# Patient Record
Sex: Male | Born: 1965 | Race: White | Hispanic: No | Marital: Married | Smoking: Current every day smoker
Health system: Southern US, Community
[De-identification: ages and names within clinical notes are randomized; demographics above are authoritative.]

## PROBLEM LIST (undated history)

## (undated) DIAGNOSIS — E785 Hyperlipidemia, unspecified: Secondary | ICD-10-CM

## (undated) DIAGNOSIS — J449 Chronic obstructive pulmonary disease, unspecified: Secondary | ICD-10-CM

## (undated) DIAGNOSIS — F419 Anxiety disorder, unspecified: Secondary | ICD-10-CM

## (undated) DIAGNOSIS — Z1388 Encounter for screening for disorder due to exposure to contaminants: Secondary | ICD-10-CM

## (undated) DIAGNOSIS — I10 Essential (primary) hypertension: Secondary | ICD-10-CM

## (undated) DIAGNOSIS — K219 Gastro-esophageal reflux disease without esophagitis: Secondary | ICD-10-CM

## (undated) DIAGNOSIS — G894 Chronic pain syndrome: Secondary | ICD-10-CM

## (undated) DIAGNOSIS — Z720 Tobacco use: Secondary | ICD-10-CM

## (undated) DIAGNOSIS — R51 Headache: Secondary | ICD-10-CM

## (undated) DIAGNOSIS — I2510 Atherosclerotic heart disease of native coronary artery without angina pectoris: Secondary | ICD-10-CM

## (undated) DIAGNOSIS — G588 Other specified mononeuropathies: Secondary | ICD-10-CM

## (undated) DIAGNOSIS — M109 Gout, unspecified: Secondary | ICD-10-CM

## (undated) HISTORY — PX: ELBOW SURGERY: SHX618

## (undated) HISTORY — PX: CARDIAC CATHETERIZATION: SHX172

---

## 2007-08-12 HISTORY — PX: KNEE SURGERY: SHX244

## 2013-01-09 ENCOUNTER — Emergency Department (HOSPITAL_COMMUNITY): Payer: 59

## 2013-01-09 ENCOUNTER — Encounter (HOSPITAL_COMMUNITY): Payer: Self-pay | Admitting: *Deleted

## 2013-01-09 ENCOUNTER — Inpatient Hospital Stay (HOSPITAL_COMMUNITY)
Admission: EM | Admit: 2013-01-09 | Discharge: 2013-01-11 | DRG: 251 | Disposition: A | Discharge status: Home / Self Care | Payer: 59 | Attending: Cardiology | Admitting: Cardiology

## 2013-01-09 DIAGNOSIS — J449 Chronic obstructive pulmonary disease, unspecified: Secondary | ICD-10-CM | POA: Diagnosis present

## 2013-01-09 DIAGNOSIS — F17200 Nicotine dependence, unspecified, uncomplicated: Secondary | ICD-10-CM | POA: Diagnosis present

## 2013-01-09 DIAGNOSIS — Z8249 Family history of ischemic heart disease and other diseases of the circulatory system: Secondary | ICD-10-CM

## 2013-01-09 DIAGNOSIS — I214 Non-ST elevation (NSTEMI) myocardial infarction: Secondary | ICD-10-CM

## 2013-01-09 DIAGNOSIS — F411 Generalized anxiety disorder: Secondary | ICD-10-CM | POA: Diagnosis present

## 2013-01-09 DIAGNOSIS — I10 Essential (primary) hypertension: Secondary | ICD-10-CM | POA: Diagnosis present

## 2013-01-09 DIAGNOSIS — I2510 Atherosclerotic heart disease of native coronary artery without angina pectoris: Secondary | ICD-10-CM | POA: Diagnosis present

## 2013-01-09 DIAGNOSIS — E7800 Pure hypercholesterolemia, unspecified: Secondary | ICD-10-CM | POA: Diagnosis present

## 2013-01-09 DIAGNOSIS — J4489 Other specified chronic obstructive pulmonary disease: Secondary | ICD-10-CM | POA: Diagnosis present

## 2013-01-09 DIAGNOSIS — E785 Hyperlipidemia, unspecified: Secondary | ICD-10-CM

## 2013-01-09 DIAGNOSIS — I2582 Chronic total occlusion of coronary artery: Secondary | ICD-10-CM | POA: Diagnosis present

## 2013-01-09 DIAGNOSIS — D45 Polycythemia vera: Secondary | ICD-10-CM | POA: Diagnosis present

## 2013-01-09 HISTORY — DX: Chronic obstructive pulmonary disease, unspecified: J44.9

## 2013-01-09 HISTORY — DX: Atherosclerotic heart disease of native coronary artery without angina pectoris: I25.10

## 2013-01-09 HISTORY — DX: Hyperlipidemia, unspecified: E78.5

## 2013-01-09 HISTORY — DX: Encounter for screening for disorder due to exposure to contaminants: Z13.88

## 2013-01-09 HISTORY — DX: Tobacco use: Z72.0

## 2013-01-09 HISTORY — DX: Anxiety disorder, unspecified: F41.9

## 2013-01-09 HISTORY — DX: Headache: R51

## 2013-01-09 HISTORY — DX: Essential (primary) hypertension: I10

## 2013-01-09 LAB — COMPREHENSIVE METABOLIC PANEL
Albumin: 3.9 g/dL (ref 3.5–5.2)
Alkaline Phosphatase: 61 U/L (ref 39–117)
BUN: 5 mg/dL — ABNORMAL LOW (ref 6–23)
CO2: 27 mEq/L (ref 19–32)
Chloride: 98 mEq/L (ref 96–112)
GFR calc Af Amer: >90 mL/min (ref >90)
GFR calc non Af Amer: >90 mL/min (ref >90)
Glucose, Bld: 106 mg/dL — ABNORMAL HIGH (ref 70–99)
Potassium: 4.2 mEq/L (ref 3.5–5.1)
Total Bilirubin: 0.4 mg/dL (ref 0.3–1.2)

## 2013-01-09 LAB — POCT I-STAT, CHEM 8
Calcium, Ion: 1.1 mmol/L — ABNORMAL LOW (ref 1.12–1.23)
Chloride: 102 mEq/L (ref 96–112)
HCT: 51 % (ref 39.0–52.0)
TCO2: 28 mmol/L (ref 0–100)

## 2013-01-09 LAB — CBC WITH DIFFERENTIAL/PLATELET
Lymphocytes Relative: 30 % (ref 12–46)
Lymphs Abs: 2.1 10*3/uL (ref 0.7–4.0)
MCH: 34.6 pg — ABNORMAL HIGH (ref 26.0–34.0)
MCHC: 35.9 g/dL (ref 30.0–36.0)
MCV: 96.4 fL (ref 78.0–100.0)
Monocytes Relative: 7 % (ref 3–12)
Platelets: 155 10*3/uL (ref 150–400)
RDW: 12.7 % (ref 11.5–15.5)
WBC: 7.2 10*3/uL (ref 4.0–10.5)

## 2013-01-09 LAB — HEPARIN LEVEL (UNFRACTIONATED): Heparin Unfractionated: <0.1 IU/mL — ABNORMAL LOW (ref 0.30–0.70)

## 2013-01-09 LAB — TYPE AND SCREEN: Antibody Screen: NEGATIVE

## 2013-01-09 LAB — PROTIME-INR: INR: 0.91 (ref 0.00–1.49)

## 2013-01-09 LAB — POCT I-STAT TROPONIN I: Troponin i, poc: 3.53 ng/mL (ref 0.00–0.08)

## 2013-01-09 LAB — ABO/RH: ABO/RH(D): A POS

## 2013-01-09 LAB — TROPONIN I: Troponin I: 4.37 ng/mL (ref <0.30)

## 2013-01-09 MED ORDER — ONDANSETRON HCL 4 MG/2ML IJ SOLN: 4.0000 mg | Freq: Four times a day (QID) | INTRAMUSCULAR | Status: DC | PRN

## 2013-01-09 MED ORDER — IOHEXOL 350 MG/ML SOLN
125.0000 mL | Freq: Once | INTRAVENOUS | Status: AC | PRN
  Administered 2013-01-09: 125 mL via INTRAVENOUS

## 2013-01-09 MED ORDER — ZOLPIDEM TARTRATE 5 MG PO TABS: 5.0000 mg | ORAL_TABLET | Freq: Every evening | ORAL | Status: DC | PRN

## 2013-01-09 MED ORDER — SODIUM CHLORIDE 0.9 % IV SOLN: INTRAVENOUS | Status: AC

## 2013-01-09 MED ORDER — DIAZEPAM 5 MG PO TABS
5.0000 mg | ORAL_TABLET | ORAL | Status: AC
  Administered 2013-01-10: 5 mg via ORAL
  Filled 2013-01-09: qty 1

## 2013-01-09 MED ORDER — SODIUM CHLORIDE 0.9 % IJ SOLN: 3.0000 mL | INTRAMUSCULAR | Status: DC | PRN

## 2013-01-09 MED ORDER — HEPARIN BOLUS VIA INFUSION
2000.0000 [IU] | Freq: Once | INTRAVENOUS | Status: AC
  Administered 2013-01-09: 2000 [IU] via INTRAVENOUS
  Filled 2013-01-09: qty 2000

## 2013-01-09 MED ORDER — NITROGLYCERIN 0.4 MG SL SUBL: 0.4000 mg | SUBLINGUAL_TABLET | SUBLINGUAL | Status: DC | PRN

## 2013-01-09 MED ORDER — SODIUM CHLORIDE 0.9 % IJ SOLN
3.0000 mL | Freq: Two times a day (BID) | INTRAMUSCULAR | Status: DC
  Administered 2013-01-09 – 2013-01-11 (×3): 3 mL via INTRAVENOUS

## 2013-01-09 MED ORDER — SODIUM CHLORIDE 0.9 % IV SOLN: 250.0000 mL | INTRAVENOUS | Status: DC | PRN

## 2013-01-09 MED ORDER — SODIUM CHLORIDE 0.9 % IV SOLN
INTRAVENOUS | Status: DC
  Administered 2013-01-09: 15:00:00 via INTRAVENOUS
  Administered 2013-01-09: 10 mL/h via INTRAVENOUS

## 2013-01-09 MED ORDER — ASPIRIN EC 81 MG PO TBEC
81.0000 mg | DELAYED_RELEASE_TABLET | Freq: Every day | ORAL | Status: DC
  Administered 2013-01-10 – 2013-01-11 (×2): 81 mg via ORAL
  Filled 2013-01-09 (×2): qty 1

## 2013-01-09 MED ORDER — ACETAMINOPHEN 325 MG PO TABS
650.0000 mg | ORAL_TABLET | ORAL | Status: DC | PRN
  Administered 2013-01-10 – 2013-01-11 (×2): 650 mg via ORAL
  Filled 2013-01-09 (×2): qty 2

## 2013-01-09 MED ORDER — HEPARIN BOLUS VIA INFUSION
4000.0000 [IU] | Freq: Once | INTRAVENOUS | Status: AC
  Administered 2013-01-09: 4000 [IU] via INTRAVENOUS

## 2013-01-09 MED ORDER — FAMOTIDINE 20 MG PO TABS
20.0000 mg | ORAL_TABLET | Freq: Two times a day (BID) | ORAL | Status: DC
  Administered 2013-01-09 – 2013-01-11 (×4): 20 mg via ORAL
  Filled 2013-01-09 (×5): qty 1

## 2013-01-09 MED ORDER — AMLODIPINE BESYLATE 10 MG PO TABS
10.0000 mg | ORAL_TABLET | Freq: Every day | ORAL | Status: DC
  Administered 2013-01-10: 10 mg via ORAL
  Filled 2013-01-09 (×3): qty 1

## 2013-01-09 MED ORDER — ALPRAZOLAM 0.5 MG PO TABS
1.0000 mg | ORAL_TABLET | Freq: Two times a day (BID) | ORAL | Status: DC | PRN
  Administered 2013-01-09 – 2013-01-11 (×4): 1 mg via ORAL
  Filled 2013-01-09: qty 2
  Filled 2013-01-09 (×2): qty 1
  Filled 2013-01-09 (×2): qty 2

## 2013-01-09 MED ORDER — HEPARIN (PORCINE) IN NACL 100-0.45 UNIT/ML-% IJ SOLN
1650.0000 [IU]/h | INTRAMUSCULAR | Status: DC
  Administered 2013-01-09: 1000 [IU]/h via INTRAVENOUS
  Administered 2013-01-10: 1300 [IU]/h via INTRAVENOUS
  Filled 2013-01-09 (×4): qty 250

## 2013-01-09 MED ORDER — METOPROLOL TARTRATE 25 MG PO TABS
25.0000 mg | ORAL_TABLET | Freq: Two times a day (BID) | ORAL | Status: DC
  Administered 2013-01-09 – 2013-01-11 (×4): 25 mg via ORAL
  Filled 2013-01-09 (×6): qty 1

## 2013-01-09 MED ORDER — SODIUM CHLORIDE 0.9 % IJ SOLN: 3.0000 mL | Freq: Two times a day (BID) | INTRAMUSCULAR | Status: DC

## 2013-01-09 MED ORDER — NITROGLYCERIN 0.4 MG SL SUBL
SUBLINGUAL_TABLET | SUBLINGUAL | Status: AC
  Administered 2013-01-09: 14:00:00
  Filled 2013-01-09: qty 25

## 2013-01-09 MED ORDER — ALPRAZOLAM ER 1 MG PO TB24: 1.0000 mg | ORAL_TABLET | Freq: Two times a day (BID) | ORAL | Status: DC | PRN

## 2013-01-09 MED ORDER — ATORVASTATIN CALCIUM 80 MG PO TABS
80.0000 mg | ORAL_TABLET | Freq: Every day | ORAL | Status: DC
  Administered 2013-01-09 – 2013-01-10 (×2): 80 mg via ORAL
  Filled 2013-01-09 (×3): qty 1

## 2013-01-09 MED ORDER — SODIUM CHLORIDE 0.9 % IV SOLN
INTRAVENOUS | Status: DC
  Administered 2013-01-09 – 2013-01-10 (×2): via INTRAVENOUS

## 2013-01-09 MED ORDER — ASPIRIN 81 MG PO CHEW
CHEWABLE_TABLET | ORAL | Status: AC
Start: 2013-01-09 — End: 2013-01-09
  Administered 2013-01-09: 14:00:00
  Filled 2013-01-09: qty 4

## 2013-01-09 NOTE — ED Notes (Signed)
Patient transported to CT 

## 2013-01-09 NOTE — ED Notes (Signed)
Pt states yesterday had a burning pain across chest, 10/10, pain in upper back and pain down both arms, today states pain is central chest, 2/10, and pain in shoulders, denies SOB, denies n/v/d, states having numbness/tingling in hands.

## 2013-01-09 NOTE — ED Notes (Signed)
Critical I stat Troponin 3.53.  Dr Freida Busman and RN notified by [facility] 1317.

## 2013-01-09 NOTE — Progress Notes (Signed)
ANTICOAGULATION CONSULT NOTE - Initial Consult  Pharmacy Consult for Heparin Indication: chest pain/ACS  No Known Allergies  Patient Measurements: Height: 5\' 11"  (180.3 cm) Weight: 198 lb (89.812 kg) IBW/kg (Calculated) : 75.3 Heparin Dosing Weight: 89.8kg  Vital Signs: Temp: 98 F (36.7 C) (06/01 1250) Temp src: Oral (06/01 1250) BP: 126/82 mmHg (06/01 1400) Pulse Rate: 86 (06/01 1400)  Labs:  Recent Labs  01/09/13 1300 01/09/13 1339  HGB 17.4* 17.3*  HCT 48.5 51.0  PLT 155  --   APTT 29  --   LABPROT 12.2  --   INR 0.91  --   CREATININE 0.72 0.70    Estimated Creatinine Clearance: 124.2 ml/min (by C-G formula based on Cr of 0.7).   Medical History: Past Medical History  Diagnosis Date  . Screening for chemical poisoning and contamination   . Hypertension     Assessment: 47 y.o. male presenting with chest pain. EKG reveals NSTEMI. Aspirin 81mg  given in ED. Plan transfer to [facility] CCU. Baseline coags wnl.  Goal of Therapy:  Heparin level 0.3-0.7 units/ml Monitor platelets by anticoagulation protocol: Yes   Plan:   Heparin 4000 units IV x 1, then  Heparin 1000 units/hr IV infusion  Check heparin level in 6hrs  Check daily heparin level, CBC  Loralee Pacas, PharmD, BCPS Pager: [phone] 01/09/2013,2:48 PM

## 2013-01-09 NOTE — Progress Notes (Signed)
ANTICOAGULATION CONSULT NOTE  Pharmacy Consult for Heparin Indication: chest pain/ACS  No Known Allergies  Patient Measurements: Height: 6' (182.9 cm) Weight: 189 lb 6 oz (85.9 kg) IBW/kg (Calculated) : 77.6 Heparin Dosing Weight: 89.8kg  Vital Signs: Temp: 98.4 F (36.9 C) (06/01 2000) Temp src: Oral (06/01 2000) BP: 129/81 mmHg (06/01 2100) Pulse Rate: 65 (06/01 2100)  Labs:  Recent Labs  01/09/13 1300 01/09/13 1339 01/09/13 1813 01/09/13 2054  HGB 17.4* 17.3*  --   --   HCT 48.5 51.0  --   --   PLT 155  --   --   --   APTT 29  --   --   --   LABPROT 12.2  --   --   --   INR 0.91  --   --   --   HEPARINUNFRC  --   --   --  <0.10*  CREATININE 0.72 0.70  --   --   TROPONINI  --   --  4.37*  --     Estimated Creatinine Clearance: 128 ml/min (by C-G formula based on Cr of 0.7).  Assessment: 47 y.o. Male with chest pain for heparin  Goal of Therapy:  Heparin level 0.3-0.7 units/ml Monitor platelets by anticoagulation protocol: Yes   Plan:  Heparin 2000 units IV bolus, then increase heparin  1300 units/hr Follow-up am labs.  Geannie Risen, PharmD, BCPS

## 2013-01-09 NOTE — ED Provider Notes (Signed)
History     CSN: [id number]  Arrival date & time 01/09/13  1235   First MD Initiated Contact with Patient 01/09/13 1248      Chief Complaint  Patient presents with  . Chest Pain    (Consider location/radiation/quality/duration/timing/severity/associated sxs/prior treatment) Patient is a 47 y.o. male presenting with chest pain. The history is provided by the patient.  Chest Pain  patient here complaining of substernal chest pain radiating to his arms and back which began yesterday. Symptoms are constant all day yesterday and not associated with syncope or near-syncope. He did have some dyspnea and diaphoresis. Symptoms resolved and were nonexertional. These symptoms returned again today at rest and has subsided this time and lasted for approximately 30-60 minutes. Denies any numbness or tingling to his arms or legs. No recent fever or cough. No treatment used prior to arrival. Nothing made her symptoms better  Past Medical History  Diagnosis Date  . Screening for chemical poisoning and contamination   . High cholesterol     Past Surgical History  Procedure Laterality Date  . Knee surgery      History reviewed. No pertinent family history.  History  Substance Use Topics  . Smoking status: Current Every Day Smoker  . Smokeless tobacco: Never Used  . Alcohol Use: Yes      Review of Systems  Cardiovascular: Positive for chest pain.  All other systems reviewed and are negative.    Allergies  Review of patient's allergies indicates no known allergies.  Home Medications   Current Outpatient Rx  Name  Route  Sig  Dispense  Refill  . ALPRAZolam (XANAX XR) 1 MG 24 hr tablet   Oral   Take 1 mg by mouth 2 (two) times daily as needed (anxiety).         . ALPRAZolam (XANAX) 1 MG tablet   Oral   Take 1 mg by mouth 2 (two) times daily as needed for sleep (anxiety).         Marland Kitchen amLODipine (NORVASC) 10 MG tablet   Oral   Take 10 mg by mouth daily.         .  famotidine (PEPCID) 20 MG tablet   Oral   Take 20 mg by mouth 2 (two) times daily.           BP 159/101  Pulse 95  Temp(Src) 98 F (36.7 C) (Oral)  Resp 20  SpO2 96%  Physical Exam  Nursing note and vitals reviewed. Constitutional: He is oriented to person, place, and time. He appears well-developed and well-nourished.  Non-toxic appearance. No distress.  HENT:  Head: Normocephalic and atraumatic.  Eyes: Conjunctivae, EOM and lids are normal. Pupils are equal, round, and reactive to light.  Neck: Normal range of motion. Neck supple. No tracheal deviation present. No mass present.  Cardiovascular: Normal rate, regular rhythm and normal heart sounds.  Exam reveals no gallop.   No murmur heard. Pulmonary/Chest: Effort normal and breath sounds normal. No stridor. No respiratory distress. He has no decreased breath sounds. He has no wheezes. He has no rhonchi. He has no rales.  Abdominal: Soft. Normal appearance and bowel sounds are normal. He exhibits no distension. There is no tenderness. There is no rebound and no CVA tenderness.  Musculoskeletal: Normal range of motion. He exhibits no edema and no tenderness.  Neurological: He is alert and oriented to person, place, and time. He has normal strength. No cranial nerve deficit or sensory deficit. GCS eye subscore  is 4. GCS verbal subscore is 5. GCS motor subscore is 6.  Skin: Skin is warm and dry. No abrasion and no rash noted.  Psychiatric: He has a normal mood and affect. His speech is normal and behavior is normal.    ED Course  Procedures (including critical care time)  Labs Reviewed  POCT I-STAT TROPONIN I - Abnormal; Notable for the following:    Troponin i, poc 3.53 (*)    All other components within normal limits  CBC WITH DIFFERENTIAL  COMPREHENSIVE METABOLIC PANEL  PROTIME-INR  APTT  TYPE AND SCREEN   No results found.   No diagnosis found.    MDM   Date: 01/09/2013  Rate: 89  Rhythm: normal sinus rhythm   QRS Axis: normal  Intervals: normal  ST/T Wave abnormalities: normal  Conduction Disutrbances:none  Narrative Interpretation:   Old EKG Reviewed: none available  2:32 PM Patient had chest CT to rule out dissection which was negative. He was given aspirin here and also heparinized. He is currently pain-free. I have spoken with Dr.Mcclean and patient to be transferred to [facility] cone to the CCU. No evidence of STEMI on the patient's EKG although the patient is having a non-STEMI        Toy Baker, MD 01/09/13 1432

## 2013-01-09 NOTE — H&P (Signed)
History and Physical   Patient ID: [name] [name] MRN: [id number], DOB/AGE: Jun 24, 1966   Admit date: 01/09/2013 Date of Consult: 01/09/2013   Primary Physician: No primary provider on file. Primary Cardiologist: New  HPI: [name] [name] is a 47 y.o. male with PMHx s/f hypertension, ongoing tobacco abuse and family history of premature CAD who presents to [facility] ED today with chest pain.   He was in his USOH until approximately 1 month ago when he began experiencing fleeting episodes of substernal chest burning which he related to indigestion. This remained stable over some time. Beginning on Friday, he reported experiencing more severe episodes described as anterior chest burning with radiation to his arms bilaterally aggravated by exertion and relieved with rest. Over the weekend these episodes became more frequent and severe (rated at a 9/10). He denies associated symptoms, specifically nausea, diaphoresis, SOB/DOE, LE edema, PND, orthopnea, palpitations, lightheadedness or syncope. The discomfort persisted thus prompting his ED presentation.   He also notes an episode of lightheadedness, weakness and hand incoordination after working on a roof in the sun. He attributed this to dehydration and rested in his car with some improvement. He does endorse bilateral hand tingling, but indicates he has chronic musculoskeletal ailments from physical labor. He recently had a complete physical (12/06/12) revealing LDL 93, HDL 48, TG 261, TC 193, normal TSH and otherwise normal blood work.   In the ED, EKG reveals NSR, inferior IVCD, no acute ischemic changes. Initial trop-I 3.53. Full labwork pending- BMET unremarkale. CBC indicates a mild erythrocytosis. CT-A and CXR pending. VSS. He received a full-dose ASA. He is currently pain free.   Problem List: Past Medical History  Diagnosis Date  . Screening for chemical poisoning and contamination   . Hypertension   . Tobacco abuse     Past Surgical History    Procedure Laterality Date  . Knee surgery       Allergies: No Known Allergies  Home Medications: Prior to Admission medications   Medication Sig Start Date End Date Taking? Authorizing Provider  ALPRAZolam (XANAX XR) 1 MG 24 hr tablet Take 1 mg by mouth 2 (two) times daily as needed (anxiety).   Yes Historical Provider, MD  ALPRAZolam Prudy Feeler) 1 MG tablet Take 1 mg by mouth 2 (two) times daily as needed for sleep (anxiety).   Yes Historical Provider, MD  amLODipine (NORVASC) 10 MG tablet Take 10 mg by mouth daily.   Yes Historical Provider, MD  famotidine (PEPCID) 20 MG tablet Take 20 mg by mouth 2 (two) times daily.   Yes Historical Provider, MD    Inpatient Medications:     (Not in a hospital admission)  Family History  Problem Relation Age of Onset  . CAD Brother 82     History   Social History  . Marital Status: Married    Spouse Name: N/A    Number of Children: N/A  . Years of Education: N/A   Occupational History  . Not on file.   Social History Main Topics  . Smoking status: Current Every Day Smoker -- 1.50 packs/day  . Smokeless tobacco: Never Used  . Alcohol Use: Yes  . Drug Use: No  . Sexually Active: Not on file   Other Topics Concern  . Not on file   Social History Narrative  . No narrative on file    Review of Systems: General: positive generalized fatigue, for reduced appetite, increased stress, weight loss, negative for chills, fever, night sweats Cardiovascular: positive  for chest pain, negative for dyspnea on exertion, edema, orthopnea, palpitations, paroxysmal nocturnal dyspnea or shortness of breath Dermatological: negative for rash Respiratory: negative for cough or wheezing Urologic: negative for hematuria Abdominal:  negative for nausea, vomiting, diarrhea, bright red blood per rectum, melena, or hematemesis Neurologic: positive for bilateral hand tingling, negative for visual changes, syncope, or dizziness All other systems reviewed and  are otherwise negative except as noted above.  Physical Exam: Blood pressure 126/82, pulse 86, temperature 98 F (36.7 C), temperature source Oral, resp. rate 18, height 5\' 11"  (1.803 m), weight 89.812 kg (198 lb), SpO2 99.00%.    General: Well developed, well nourished, in no acute distress. Head: Normocephalic, atraumatic, sclera non-icteric, no xanthomas, nares are without discharge.  Neck: Negative for carotid bruits. JVD not elevated. Lungs:  He has + wheezes with expiration.   Breathing is unlabored. Heart: RRR with S1 S2. No murmurs, rubs,   Abdomen:  Soft, non-tender, non-distended with normoactive bowel sounds. No hepatomegaly. No rebound/guarding. No obvious abdominal masses. Msk: Strength and tone appears normal for age. Extremities:  No clubbing, cyanosis or edema.  Distal pedal pulses are 2+ and equal bilaterally. Good right radial pulse.  Neuro: Alert and oriented X 3. Moves all extremities spontaneously. Psych:  Responds to questions appropriately with a normal affect.  Labs: Recent Labs     01/09/13  1300  01/09/13  1339  WBC  7.2   --   HGB  17.4*  17.3*  HCT  48.5  51.0  MCV  96.4   --   PLT  155   --    Recent Labs Lab 01/09/13 1300 01/09/13 1339  NA 137 137  K 4.2 4.1  CL 98 102  CO2 27  --   BUN 5* 4*  CREATININE 0.72 0.70  CALCIUM 9.2  --   PROT 7.2  --   BILITOT 0.4  --   ALKPHOS 61  --   ALT 25  --   AST 56*  --   GLUCOSE 106* 102*   Radiology/Studies: No results found.  EKG: NSR, 89 bpm, IVCD III, aVF, no ST/T changes  ASSESSMENT AND PLAN:   47 y.o. male with PMHx s/f hypertension, ongoing tobacco abuse and family history of premature CAD who presents to [facility] ED today with chest pain.   1. NSTEMI 2. Hypertension 3. Ongoing tobacco abuse 4. Polycythemia  The patient presents with a one month history of intermittent precordial discomfort described as burning radiating to his arm bilaterally, alleviated with rest, but occurring  for frequently and qualified as more severe. Cardiac risk factors include hypertension, ongoing tobacco abuse and family history of premature CAD in his brother. Objectively, troponin-I has returned elevated. EKG indicates inferior conduction abnormality, but no acute ischemia. Plan in ED has been made to rule out PE and ascending thoracic aortic dissection on CT-A. This along with a CXR is pending at this time. If no other pathology explaining NSTEMI is demonstrated, will plan to heparinize and admit to [facility]. He is currently pain free and hemodynamically stable. If this remains the case, will plan to proceed with cardiac catheterization tomorrow. Cycle cardiac biomarkers. Hydrate overnight. Offer NRT as a means of tobacco cessation assistance. Add BB and high-dose statin.    Signed, R. Hurman Horn, PA-C 01/09/2013, 3:01 PM   Attending Note:   The patient was seen and examined.  Agree with assessment and plan as noted above.  Changes made to the above note  as needed.  Very nice gentleman with hx of HTN and hyperlipidemia and family hx of premature CAD.  Presents with NSTEMI that started yesterday.  He feels better today.    He works in the [employer] - does lots of physical labor.  Has been having indigestion like symptoms with CP radiating to both arms.  Yesterday , he woke up with those symptoms and they lasted for hours.  Will admit. Transfer to stepdown at [facility].  Add heparin. Anticipate cath tomorrow. We have discussed risks, benefits, options of cath.  He understands and agrees to proceed  Alvia Grove., MD, [facility] 01/09/2013, 3:03 PM

## 2013-01-10 ENCOUNTER — Encounter (HOSPITAL_COMMUNITY)
Admission: EM | Disposition: A | Discharge status: Home / Self Care | Payer: Self-pay | Source: Home / Self Care | Attending: Cardiology

## 2013-01-10 DIAGNOSIS — I214 Non-ST elevation (NSTEMI) myocardial infarction: Secondary | ICD-10-CM

## 2013-01-10 DIAGNOSIS — I2510 Atherosclerotic heart disease of native coronary artery without angina pectoris: Secondary | ICD-10-CM

## 2013-01-10 HISTORY — PX: LEFT HEART CATHETERIZATION WITH CORONARY ANGIOGRAM: SHX5451

## 2013-01-10 LAB — BASIC METABOLIC PANEL
CO2: 28 mEq/L (ref 19–32)
Calcium: 9 mg/dL (ref 8.4–10.5)
Potassium: 3.8 mEq/L (ref 3.5–5.1)
Sodium: 138 mEq/L (ref 135–145)

## 2013-01-10 LAB — CBC
MCH: 34.2 pg — ABNORMAL HIGH (ref 26.0–34.0)
MCHC: 34.3 g/dL (ref 30.0–36.0)
Platelets: 142 10*3/uL — ABNORMAL LOW (ref 150–400)
RBC: 4.62 MIL/uL (ref 4.22–5.81)

## 2013-01-10 LAB — TROPONIN I: Troponin I: 4.33 ng/mL (ref <0.30)

## 2013-01-10 SURGERY — LEFT HEART CATHETERIZATION WITH CORONARY ANGIOGRAM
Anesthesia: LOCAL

## 2013-01-10 MED ORDER — VERAPAMIL HCL 2.5 MG/ML IV SOLN
INTRAVENOUS | Status: AC
  Filled 2013-01-10: qty 2

## 2013-01-10 MED ORDER — VITAMIN B-1 100 MG PO TABS
100.0000 mg | ORAL_TABLET | Freq: Every day | ORAL | Status: DC
  Administered 2013-01-10 – 2013-01-11 (×2): 100 mg via ORAL
  Filled 2013-01-10 (×2): qty 1

## 2013-01-10 MED ORDER — SODIUM CHLORIDE 0.9 % IV SOLN: INTRAVENOUS | Status: AC

## 2013-01-10 MED ORDER — MIDAZOLAM HCL 2 MG/2ML IJ SOLN
INTRAMUSCULAR | Status: AC
  Filled 2013-01-10: qty 2

## 2013-01-10 MED ORDER — LIDOCAINE HCL (PF) 1 % IJ SOLN
INTRAMUSCULAR | Status: AC
  Filled 2013-01-10: qty 30

## 2013-01-10 MED ORDER — BIVALIRUDIN 250 MG IV SOLR
INTRAVENOUS | Status: AC
  Filled 2013-01-10: qty 250

## 2013-01-10 MED ORDER — LORAZEPAM 2 MG/ML IJ SOLN: 1.0000 mg | Freq: Four times a day (QID) | INTRAMUSCULAR | Status: DC | PRN

## 2013-01-10 MED ORDER — HEPARIN (PORCINE) IN NACL 2-0.9 UNIT/ML-% IJ SOLN
INTRAMUSCULAR | Status: AC
  Filled 2013-01-10: qty 1000

## 2013-01-10 MED ORDER — NITROGLYCERIN IN D5W 200-5 MCG/ML-% IV SOLN
2.0000 ug/min | INTRAVENOUS | Status: DC
  Administered 2013-01-10: 10 ug/min via INTRAVENOUS
  Filled 2013-01-10: qty 250

## 2013-01-10 MED ORDER — THIAMINE HCL 100 MG/ML IJ SOLN
100.0000 mg | Freq: Every day | INTRAMUSCULAR | Status: DC
  Filled 2013-01-10 (×2): qty 1

## 2013-01-10 MED ORDER — LORAZEPAM 1 MG PO TABS: 1.0000 mg | ORAL_TABLET | Freq: Four times a day (QID) | ORAL | Status: DC | PRN

## 2013-01-10 MED ORDER — FENTANYL CITRATE 0.05 MG/ML IJ SOLN
INTRAMUSCULAR | Status: AC
  Filled 2013-01-10: qty 2

## 2013-01-10 MED ORDER — PRASUGREL HCL 10 MG PO TABS
10.0000 mg | ORAL_TABLET | Freq: Every day | ORAL | Status: DC
  Administered 2013-01-11: 10 mg via ORAL
  Filled 2013-01-10: qty 1

## 2013-01-10 MED ORDER — FOLIC ACID 1 MG PO TABS
1.0000 mg | ORAL_TABLET | Freq: Every day | ORAL | Status: DC
  Administered 2013-01-10 – 2013-01-11 (×2): 1 mg via ORAL
  Filled 2013-01-10 (×2): qty 1

## 2013-01-10 MED ORDER — ADULT MULTIVITAMIN W/MINERALS CH
1.0000 | ORAL_TABLET | Freq: Every day | ORAL | Status: DC
  Administered 2013-01-10 – 2013-01-11 (×2): 1 via ORAL
  Filled 2013-01-10 (×2): qty 1

## 2013-01-10 MED ORDER — PRASUGREL HCL 10 MG PO TABS
ORAL_TABLET | ORAL | Status: AC
  Filled 2013-01-10: qty 6

## 2013-01-10 MED ORDER — HEPARIN SODIUM (PORCINE) 1000 UNIT/ML IJ SOLN
INTRAMUSCULAR | Status: AC
  Filled 2013-01-10: qty 1

## 2013-01-10 NOTE — Progress Notes (Signed)
PROGRESS NOTE  Subjective:   Pt is doing well.    Objective:    Vital Signs:   Temp:  [98 F (36.7 C)-98.5 F (36.9 C)] 98 F (36.7 C) (06/02 0402) Pulse Rate:  [65-95] 65 (06/01 2100) Resp:  [12-28] 15 (06/02 0700) BP: (102-159)/(67-101) 129/72 mmHg (06/02 0402) SpO2:  [96 %-100 %] 97 % (06/02 0402) Weight:  [189 lb (85.73 kg)-198 lb (89.812 kg)] 189 lb (85.73 kg) (06/02 0405)  Last BM Date: 01/09/13   24-hour weight change: Weight change:   Weight trends: Filed Weights   01/09/13 1444 01/09/13 1700 01/10/13 0405  Weight: 198 lb (89.812 kg) 189 lb 6 oz (85.9 kg) 189 lb (85.73 kg)    Intake/Output:  06/01 0701 - 06/02 0700 In: 1334.5 [P.O.:440; I.V.:894.5] Out: 1500 [Urine:1500]     Physical Exam: BP 129/72  Pulse 65  Temp(Src) 98 F (36.7 C) (Oral)  Resp 15  Ht 6' (1.829 m)  Wt 189 lb (85.73 kg)  BMI 25.63 kg/m2  SpO2 97%  General: Vital signs reviewed and noted.   Head: Normocephalic, atraumatic.  Eyes: conjunctivae/corneas clear.  EOM's intact.   Throat: normal  Neck:  normal  Lungs:    clear  Heart:  RR, normal S1, S2  Abdomen:  Soft, non-tender, non-distended    Extremities: No c/c/e   Neurologic: A&O X3, CN II - XII are grossly intact.   Psych: Normal     Labs: BMET:  Recent Labs  01/09/13 1300 01/09/13 1339 01/10/13 0416  NA 137 137 138  K 4.2 4.1 3.8  CL 98 102 102  CO2 27  --  28  GLUCOSE 106* 102* 98  BUN 5* 4* 6  CREATININE 0.72 0.70 0.73  CALCIUM 9.2  --  9.0    Liver function tests:  Recent Labs  01/09/13 1300  AST 56*  ALT 25  ALKPHOS 61  BILITOT 0.4  PROT 7.2  ALBUMIN 3.9   No results found for this basename: LIPASE, AMYLASE,  in the last 72 hours  CBC:  Recent Labs  01/09/13 1300 01/09/13 1339 01/10/13 0600  WBC 7.2  --  6.8  NEUTROABS 4.4  --   --   HGB 17.4* 17.3* 15.8  HCT 48.5 51.0 46.1  MCV 96.4  --  99.8  PLT 155  --  142*    Cardiac Enzymes:  Recent Labs  01/09/13 1813  01/09/13 2326  TROPONINI 4.37* 4.33*    Coagulation Studies:  Recent Labs  01/09/13 1300  LABPROT 12.2  INR 0.91    Other: No components found with this basename: POCBNP,  No results found for this basename: DDIMER,  in the last 72 hours No results found for this basename: HGBA1C,  in the last 72 hours No results found for this basename: CHOL, HDL, LDLCALC, TRIG, CHOLHDL,  in the last 72 hours No results found for this basename: TSH, T4TOTAL, FREET3, T3FREE, THYROIDAB,  in the last 72 hours No results found for this basename: VITAMINB12, FOLATE, FERRITIN, TIBC, IRON, RETICCTPCT,  in the last 72 hours   Other results:  Tele:  NSR:    Medications:    Infusions: . sodium chloride 50 mL/hr at 01/10/13 0530  . heparin 1,650 Units/hr (01/10/13 0657)    Scheduled Medications: . amLODipine  10 mg Oral Daily  . aspirin EC  81 mg Oral Daily  . atorvastatin  80 mg Oral q1800  . diazepam  5 mg Oral On Call  .  famotidine  20 mg Oral BID  . folic acid  1 mg Oral Daily  . metoprolol tartrate  25 mg Oral BID  . multivitamin with minerals  1 tablet Oral Daily  . sodium chloride  3 mL Intravenous Q12H  . sodium chloride  3 mL Intravenous Q12H  . thiamine  100 mg Oral Daily   Or  . thiamine  100 mg Intravenous Daily    Assessment/ Plan:    1. CAD:  For cath this am.  Will anticipate right radial approach.    Disposition: cath today. Length of Stay: 1  [name], [name]., MD, [facility] 01/10/2013, 7:44 AM Office [phone] Pager [phone]

## 2013-01-10 NOTE — CV Procedure (Signed)
    Cardiac Cath Note  [name] [name] [id number] [birth year]  Procedure: Left  Heart Cardiac Catheterization Note Indications: NSTEMI,   Procedure Details Consent: Obtained Time Out: Verified patient identification, verified procedure, site/side was marked, verified correct patient position, special equipment/implants available, Radiology Safety Procedures followed,  medications/allergies/relevent history reviewed, required imaging and test results available.  Performed   Medications: Fentanyl: 50 mcg IV Versed: 4 mg IV Verapamil 3 mg IA Heparin 4500 units   The right radial  artery was easily canulated using a modified Seldinger technique.  Hemodynamics:    LV pressure: 102/11 Aortic pressure: 105/68  Angiography   Left Main: normal  Left anterior Descending: normal , high 1st diagonal , normal.  The 2nd diagonal has a 40-50% stenosis.  Left Circumflex: large and dominant.  OM1 is small - moderate in size and is normal. OM2 - large and normal.  PLA branch is large and normal   Right Coronary Artery: occluded proximally.  The distal vessel fills via left to right collaterals.  There is a large RV marginal branch that arises prior to the occlusion.  LV Gram: low - normal LV systolic function.  There is a small area of hypokinesis of the mid inferior wall.  EF 55%  Complications: No apparent complications Patient did tolerate procedure well.  Contrast used: 70 cc  Conclusions:   1. 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  The occlusion appears to be recent. 2. Overall well preserved LV function with  A small area of hypokinesis of the mid inferior wall.   I have discussed the case with Dr. Sanjuana Kava who will proceed with PCI of the RCA .   [name] [name], [name] [name]., MD, [facility] 01/10/2013, 12:41 PM Office - [phone] Pager [phone]

## 2013-01-10 NOTE — CV Procedure (Signed)
   Cardiac Catheterization Operative Report  [name] [name] [id number] 6/2/20141:10 PM No primary provider on file.  Procedure Performed:  1. PTCA with balloon angioplasty only of the occluded RCA  Operator: Verne Carrow, MD  Indication:   47 yo male with history of tobacco abuse, hyperlipidemia admitted with NSTEMI. Diagnostic cath this am per Dr. Elease Hashimoto with occlusion of RCA in the proximal segment. I am asked to attempt PCI of the occluded vessel.                                      Procedure Details: The risks, benefits, complications, treatment options, and expected outcomes were discussed with the patient. The patient and/or family concurred with the proposed plan, giving informed consent. When I entered the case, a 5 French sheath was present in the right radial artery. I exchanged the sheath for a 6 [place] system. The patient was given 60 mg Effient po x 1. He was given a bolus of Angiomax and a drip was started. I then engaged the RCA with a 6 [place] JR4 guide. When the ACT was greater than 200, I passed a BMW wire down the RCA. The wire passed easily beyond the proximal total occlusion. I then inflated a 2.5 x 12 mm balloon x 4 in the proximal and mid vessel. There was faint flow in the mid and distal vessel however the vessel appeared to be small in caliber and diffusely diseased. IC NTG was given. There was TIMI-1 flow into the mid and distal vessel. I reviewed the films with Dr. Elease Hashimoto and we made the decision to terminate the procedure. Diagnostic cath did show filling of the right PDA from left to right collaterals. The wire and guide were removed. The sheath was removed.  A Terumo hemostasis band was applied on the right wrist.   There were no immediate complications. The patient was taken to the recovery area in stable condition.   Hemodynamic Findings: Central aortic pressure: 105/68  Impression: 1. NSTEMI secondary to total occlusion of the proximal RCA 2. PTCA of the  proximal RCA with balloon angioplasty only. The RCA was found to be small in caliber and diffusely diseased from the proximal vessel throughout the mid and distal vessel and into the PDA.   Recommendations: I would treat him with dual anti-platelet therapy (ASA/Effient) for at least one month. Continue statin and beta blocker.        Complications:  None; patient tolerated the procedure well.

## 2013-01-10 NOTE — H&P (View-Only) (Signed)
 PROGRESS NOTE  Subjective:   Pt is doing well.    Objective:    Vital Signs:   Temp:  [98 F (36.7 C)-98.5 F (36.9 C)] 98 F (36.7 C) (06/02 0402) Pulse Rate:  [65-95] 65 (06/01 2100) Resp:  [12-28] 15 (06/02 0700) BP: (102-159)/(67-101) 129/72 mmHg (06/02 0402) SpO2:  [96 %-100 %] 97 % (06/02 0402) Weight:  [189 lb (85.73 kg)-198 lb (89.812 kg)] 189 lb (85.73 kg) (06/02 0405)  Last BM Date: 01/09/13   24-hour weight change: Weight change:   Weight trends: Filed Weights   01/09/13 1444 01/09/13 1700 01/10/13 0405  Weight: 198 lb (89.812 kg) 189 lb 6 oz (85.9 kg) 189 lb (85.73 kg)    Intake/Output:  06/01 0701 - 06/02 0700 In: 1334.5 [P.O.:440; I.V.:894.5] Out: 1500 [Urine:1500]     Physical Exam: BP 129/72  Pulse 65  Temp(Src) 98 F (36.7 C) (Oral)  Resp 15  Ht 6' (1.829 m)  Wt 189 lb (85.73 kg)  BMI 25.63 kg/m2  SpO2 97%  General: Vital signs reviewed and noted.   Head: Normocephalic, atraumatic.  Eyes: conjunctivae/corneas clear.  EOM's intact.   Throat: normal  Neck:  normal  Lungs:    clear  Heart:  RR, normal S1, S2  Abdomen:  Soft, non-tender, non-distended    Extremities: No c/c/e   Neurologic: A&O X3, CN II - XII are grossly intact.   Psych: Normal     Labs: BMET:  Recent Labs  01/09/13 1300 01/09/13 1339 01/10/13 0416  NA 137 137 138  K 4.2 4.1 3.8  CL 98 102 102  CO2 27  --  28  GLUCOSE 106* 102* 98  BUN 5* 4* 6  CREATININE 0.72 0.70 0.73  CALCIUM 9.2  --  9.0    Liver function tests:  Recent Labs  01/09/13 1300  AST 56*  ALT 25  ALKPHOS 61  BILITOT 0.4  PROT 7.2  ALBUMIN 3.9   No results found for this basename: LIPASE, AMYLASE,  in the last 72 hours  CBC:  Recent Labs  01/09/13 1300 01/09/13 1339 01/10/13 0600  WBC 7.2  --  6.8  NEUTROABS 4.4  --   --   HGB 17.4* 17.3* 15.8  HCT 48.5 51.0 46.1  MCV 96.4  --  99.8  PLT 155  --  142*    Cardiac Enzymes:  Recent Labs  01/09/13 1813  01/09/13 2326  TROPONINI 4.37* 4.33*    Coagulation Studies:  Recent Labs  01/09/13 1300  LABPROT 12.2  INR 0.91    Other: No components found with this basename: POCBNP,  No results found for this basename: DDIMER,  in the last 72 hours No results found for this basename: HGBA1C,  in the last 72 hours No results found for this basename: CHOL, HDL, LDLCALC, TRIG, CHOLHDL,  in the last 72 hours No results found for this basename: TSH, T4TOTAL, FREET3, T3FREE, THYROIDAB,  in the last 72 hours No results found for this basename: VITAMINB12, FOLATE, FERRITIN, TIBC, IRON, RETICCTPCT,  in the last 72 hours   Other results:  Tele:  NSR:    Medications:    Infusions: . sodium chloride 50 mL/hr at 01/10/13 0530  . heparin 1,650 Units/hr (01/10/13 0657)    Scheduled Medications: . amLODipine  10 mg Oral Daily  . aspirin EC  81 mg Oral Daily  . atorvastatin  80 mg Oral q1800  . diazepam  5 mg Oral On Call  .   famotidine  20 mg Oral BID  . folic acid  1 mg Oral Daily  . metoprolol tartrate  25 mg Oral BID  . multivitamin with minerals  1 tablet Oral Daily  . sodium chloride  3 mL Intravenous Q12H  . sodium chloride  3 mL Intravenous Q12H  . thiamine  100 mg Oral Daily   Or  . thiamine  100 mg Intravenous Daily    Assessment/ Plan:    1. CAD:  For cath this am.  Will anticipate right radial approach.    Disposition: cath today. Length of Stay: 1  Adi Seales J. Roger Fasnacht, Jr., MD, FACC 01/10/2013, 7:44 AM Office [phone] Pager [phone]    

## 2013-01-10 NOTE — Interval H&P Note (Signed)
History and Physical Interval Note:  01/10/2013 11:54 AM  [name] [name]  has presented today for surgery, with the diagnosis of cp  The various methods of treatment have been discussed with the patient and family. After consideration of risks, benefits and other options for treatment, the patient has consented to  Procedure(s): LEFT HEART CATHETERIZATION WITH CORONARY ANGIOGRAM (N/A) as a surgical intervention .  The patient's history has been reviewed, patient examined, no change in status, stable for surgery.  I have reviewed the patient's chart and labs.  Questions were answered to the patient's satisfaction.     Elyn Aquas.

## 2013-01-10 NOTE — Progress Notes (Signed)
ANTICOAGULATION CONSULT NOTE  Pharmacy Consult for Heparin Indication: chest pain/ACS  No Known Allergies  Patient Measurements: Height: 6' (182.9 cm) Weight: 189 lb (85.73 kg) IBW/kg (Calculated) : 77.6 Heparin Dosing Weight: 89.8kg  Vital Signs: Temp: 98 F (36.7 C) (06/02 0402) Temp src: Oral (06/02 0402) BP: 129/72 mmHg (06/02 0402) Pulse Rate: 65 (06/01 2100)  Labs:  Recent Labs  01/09/13 1300 01/09/13 1339 01/09/13 1813 01/09/13 2054 01/09/13 2326 01/10/13 0416 01/10/13 0600  HGB 17.4* 17.3*  --   --   --   --  15.8  HCT 48.5 51.0  --   --   --   --  46.1  PLT 155  --   --   --   --   --  142*  APTT 29  --   --   --   --   --   --   LABPROT 12.2  --   --   --   --   --   --   INR 0.91  --   --   --   --   --   --   HEPARINUNFRC  --   --   --  <0.10*  --   --  0.16*  CREATININE 0.72 0.70  --   --   --  0.73  --   TROPONINI  --   --  4.37*  --  4.33*  --   --     Estimated Creatinine Clearance: 128 ml/min (by C-G formula based on Cr of 0.73).  Assessment: 47 y.o. Male with chest pain for heparin. Heparin level (0.16) is below-goal on 1300 units/hr. Plan for cath later today.   Goal of Therapy:  Heparin level 0.3-0.7 units/ml Monitor platelets by anticoagulation protocol: Yes   Plan:  1. Increase IV heparin to 1650 units/hr.  2. Heparin level in 6 hours vs post-cath.   Lorre Munroe, PharmD 01/10/13, 0981 AM

## 2013-01-11 ENCOUNTER — Encounter: Payer: Self-pay | Admitting: *Deleted

## 2013-01-11 ENCOUNTER — Encounter (HOSPITAL_COMMUNITY): Payer: Self-pay | Admitting: Physician Assistant

## 2013-01-11 ENCOUNTER — Other Ambulatory Visit: Payer: Self-pay | Admitting: Physician Assistant

## 2013-01-11 ENCOUNTER — Telehealth: Payer: Self-pay | Admitting: Cardiovascular Disease

## 2013-01-11 DIAGNOSIS — I2510 Atherosclerotic heart disease of native coronary artery without angina pectoris: Secondary | ICD-10-CM | POA: Insufficient documentation

## 2013-01-11 DIAGNOSIS — I214 Non-ST elevation (NSTEMI) myocardial infarction: Secondary | ICD-10-CM

## 2013-01-11 DIAGNOSIS — I10 Essential (primary) hypertension: Secondary | ICD-10-CM

## 2013-01-11 DIAGNOSIS — E785 Hyperlipidemia, unspecified: Secondary | ICD-10-CM

## 2013-01-11 LAB — CBC
HCT: 43.8 % (ref 39.0–52.0)
Hemoglobin: 15.3 g/dL (ref 13.0–17.0)
MCH: 35 pg — ABNORMAL HIGH (ref 26.0–34.0)
MCHC: 34.9 g/dL (ref 30.0–36.0)

## 2013-01-11 LAB — BASIC METABOLIC PANEL
BUN: 10 mg/dL (ref 6–23)
Calcium: 9.2 mg/dL (ref 8.4–10.5)
GFR calc non Af Amer: >90 mL/min (ref >90)
Glucose, Bld: 104 mg/dL — ABNORMAL HIGH (ref 70–99)

## 2013-01-11 LAB — LIPID PANEL: LDL Cholesterol: 82 mg/dL (ref 0–99)

## 2013-01-11 MED ORDER — ALBUTEROL SULFATE HFA 108 (90 BASE) MCG/ACT IN AERS
2.0000 | INHALATION_SPRAY | Freq: Four times a day (QID) | RESPIRATORY_TRACT | Status: DC | PRN
  Filled 2013-01-11: qty 6.7

## 2013-01-11 MED ORDER — ALBUTEROL SULFATE HFA 108 (90 BASE) MCG/ACT IN AERS: 2.0000 | INHALATION_SPRAY | Freq: Four times a day (QID) | RESPIRATORY_TRACT | Status: DC | PRN

## 2013-01-11 MED ORDER — METOPROLOL TARTRATE 25 MG PO TABS: 25.0000 mg | ORAL_TABLET | Freq: Two times a day (BID) | ORAL | Status: DC

## 2013-01-11 MED ORDER — PRASUGREL HCL 10 MG PO TABS: 10.0000 mg | ORAL_TABLET | Freq: Every day | ORAL | Status: DC

## 2013-01-11 MED ORDER — ROSUVASTATIN CALCIUM 10 MG PO TABS: 10.0000 mg | ORAL_TABLET | Freq: Every day | ORAL | Status: DC

## 2013-01-11 MED ORDER — NITROGLYCERIN 0.4 MG SL SUBL: 0.4000 mg | SUBLINGUAL_TABLET | SUBLINGUAL | Status: DC | PRN

## 2013-01-11 MED ORDER — AMLODIPINE BESYLATE 5 MG PO TABS
5.0000 mg | ORAL_TABLET | Freq: Every day | ORAL | Status: DC
  Administered 2013-01-11: 5 mg via ORAL
  Filled 2013-01-11: qty 1

## 2013-01-11 MED ORDER — LISINOPRIL 10 MG PO TABS: 10.0000 mg | ORAL_TABLET | Freq: Every day | ORAL | Status: DC

## 2013-01-11 MED ORDER — AMLODIPINE BESYLATE 5 MG PO TABS: 5.0000 mg | ORAL_TABLET | Freq: Every day | ORAL | Status: DC

## 2013-01-11 MED ORDER — LISINOPRIL 10 MG PO TABS
10.0000 mg | ORAL_TABLET | Freq: Every day | ORAL | Status: DC
  Administered 2013-01-11: 10 mg via ORAL
  Filled 2013-01-11: qty 1

## 2013-01-11 MED ORDER — ASPIRIN 81 MG PO TBEC: 81.0000 mg | DELAYED_RELEASE_TABLET | Freq: Every day | ORAL | Status: DC

## 2013-01-11 MED FILL — Sodium Chloride IV Soln 0.9%: INTRAVENOUS | Qty: 50 | Status: AC

## 2013-01-11 NOTE — Progress Notes (Signed)
1320 Discharge instructions explained to patient with understanding verbalized. PIV removed, catheter intact, site clean and dry. Pt discharged home with wife.

## 2013-01-11 NOTE — Progress Notes (Signed)
PROGRESS NOTE  Subjective:   Pt is doing well.    Was found to have a completely occluded RCA.  We attempted PCI but when the complete occlusion was opened, we saw that the RCA was severely and diffusely diseased and was not a candidate for stenting.  He has developed left to right collaterals. He remains pain free.  Objective:    Vital Signs:   Temp:  [97.8 F (36.6 C)-98.7 F (37.1 C)] 98.2 F (36.8 C) (06/03 0414) Pulse Rate:  [54-77] 76 (06/02 1900) Resp:  [18] 18 (06/02 0750) BP: (108-137)/(66-93) 124/75 mmHg (06/03 0414) SpO2:  [93 %-98 %] 94 % (06/03 0414) Weight:  [192 lb 3.2 oz (87.181 kg)] 192 lb 3.2 oz (87.181 kg) (06/03 0414)  Last BM Date: 01/09/13   24-hour weight change: Weight change: -5 lb 12.8 oz (-2.631 kg)  Weight trends: Filed Weights   01/09/13 1700 01/10/13 0405 01/11/13 0414  Weight: 189 lb 6 oz (85.9 kg) 189 lb (85.73 kg) 192 lb 3.2 oz (87.181 kg)    Intake/Output:  06/02 0701 - 06/03 0700 In: 1197 [P.O.:550; I.V.:647] Out: 750 [Urine:750]     Physical Exam: BP 124/75  Pulse 76  Temp(Src) 98.2 F (36.8 C) (Oral)  Resp 18  Ht 6' (1.829 m)  Wt 192 lb 3.2 oz (87.181 kg)  BMI 26.06 kg/m2  SpO2 94%  General: Vital signs reviewed and noted.   Head: Normocephalic, atraumatic.  Eyes: conjunctivae/corneas clear.  EOM's intact.   Throat: normal  Neck:  normal  Lungs:   Bilateral wheezing  Heart:  RR, normal S1, S2  Abdomen:  Soft, non-tender, non-distended    Extremities: No c/c/e   Neurologic: A&O X3, CN II - XII are grossly intact.   Psych: Normal     Labs: BMET:  Recent Labs  01/10/13 0416 01/11/13 0430  NA 138 137  K 3.8 3.9  CL 102 101  CO2 28 26  GLUCOSE 98 104*  BUN 6 10  CREATININE 0.73 0.89  CALCIUM 9.0 9.2    Liver function tests:  Recent Labs  01/09/13 1300  AST 56*  ALT 25  ALKPHOS 61  BILITOT 0.4  PROT 7.2  ALBUMIN 3.9   No results found for this basename: LIPASE, AMYLASE,  in the last 72  hours  CBC:  Recent Labs  01/09/13 1300  01/10/13 0600 01/11/13 0430  WBC 7.2  --  6.8 9.3  NEUTROABS 4.4  --   --   --   HGB 17.4*  < > 15.8 15.3  HCT 48.5  < > 46.1 43.8  MCV 96.4  --  99.8 100.2*  PLT 155  --  142* 140*  < > = values in this interval not displayed.  Cardiac Enzymes:  Recent Labs  01/09/13 1813 01/09/13 2326  TROPONINI 4.37* 4.33*    Coagulation Studies:  Recent Labs  01/09/13 1300  LABPROT 12.2  INR 0.91     Other results:  Tele:  NSR:    Medications:    Infusions: . nitroGLYCERIN 10 mcg/min (01/10/13 1700)    Scheduled Medications: . amLODipine  10 mg Oral Daily  . aspirin EC  81 mg Oral Daily  . atorvastatin  80 mg Oral q1800  . famotidine  20 mg Oral BID  . folic acid  1 mg Oral Daily  . metoprolol tartrate  25 mg Oral BID  . multivitamin with minerals  1 tablet Oral Daily  . prasugrel  10 mg  Oral Daily  . sodium chloride  3 mL Intravenous Q12H  . thiamine  100 mg Oral Daily   Or  . thiamine  100 mg Intravenous Daily    Assessment/ Plan:    1. CAD:  continous medical therapy.  Add crestor 10 mg a day Continue effient for 1 month.  Continue ASA indefinitely. Add ntg prn.  2. COPD / asthma:  Will add albuterol prn.  He needs to see his medical doctor or a pulmonlogist  and get started on standard COPD / asthma meds.   3. Hyperlipidemia:  Starting crestor 10 a day.  He has not tolerated lipitor in the past.   4. HTN:  i would like for him to be on Lisinopril.  Will decrease norvasc to 5 and start Lisiniopril 10    Disposition: cath today. Length of Stay: 2  [name], [name]., MD, [facility] 01/11/2013, 7:29 AM Office [phone] Pager [phone]

## 2013-01-11 NOTE — Discharge Summary (Signed)
Discharge Summary   Patient ID: [name] [name] MRN: [id number], DOB/AGE: 1966/01/12 47 y.o. Admit date: 01/09/2013 D/C date:     01/11/2013  Primary Cardiologist: Nahser  Primary Discharge Diagnoses:  1. NSTEMI/newly diagnosed CAD - prox RCA occlusion s/p PTCA 01/10/13 2. COPD/asthma 3. HLD 4. HTN - statin started this admission, consider OP f/u labs 5. Tobacco abuse  Secondary Discharge Diagnoses:  1. Screening for chemical poisoning and contamination 2. Anxiety 3. Headache  Hospital Course: [name] [name] is is 47 y.o. male with PMHx s/f hypertension, tobacco abuse and family history of premature CAD who presented to [facility] ED on 01/09/13. Over the past 1 month he has been experiencing fleeting episodes of substernal chest burning which he related to indigestion. However, this became progressively more severe and frequent, aggravated by exertion and relieved with rest thus he came to the ED. In the ER, EKG reveals NSR, inferior IVCD, no acute ischemic changes. Initial troponin was elevated at 3.53 and later peaked at 4.37. He received ASA and was started on IV heparin per pharmacy. CT angio was performed showing no aortic dissection, aneurysm, or other acute abnormality. He underwent cardiac cath 6/2 showing total occlusion of the proximal RCA which appeared to be recent. There was otherwise nonobstructive disease in diag2. The patient subsequently underwent PTCA of prox RCA with balloon angioplasty only (The RCA was found to be small in caliber and diffusely diseased from the proximal vessel throughout the mid and distal vessel and into the PDA. Diagnostic cath did show filling of the right PDA from left to right collaterals.) Dr. Clifton [name] recommended ASa/Effient for at least one month. The patient had overall well preserved LV function with a small area of hypokinesis of the mid inferior wall. He tolerated this procedure well. He has since been started on BB and statin (Crestor -- he has not  tolerated lipitor in the past per notes). Dr. Elease [name] has seen and examined him today and feels he is stable for discharge, and feels he may RTW in 1 week. Per protocol, he will have a TOC appt within 7-10 days given NSTEMI - per discussion with schedulers, there is very limited appointment availability thus the next available appt until several weeks from now (including PA/NP availability) is with [name] [name] on 01/20/13 at 2pm. He will likely f/u with Dr. Elease [name] for subsequent appointments.  Since he was started on an ACEI, we will also check a BMET at that visit. Dr. Elease [name] recommended f/u with PCP to discuss eval for COPD/asthma and added PRN albuterol.  Discharge Vitals: Blood pressure 116/82, pulse 64, temperature 98.5 F (36.9 C), temperature source Oral, resp. rate 18, height 6' (1.829 m), weight 192 lb 3.2 oz (87.181 kg), SpO2 97.00%.  Labs: Lab Results  Component Value Date   WBC 9.3 01/11/2013   HGB 15.3 01/11/2013   HCT 43.8 01/11/2013   MCV 100.2* 01/11/2013   PLT 140* 01/11/2013    Recent Labs Lab 01/09/13 1300  01/11/13 0430  NA 137  < > 137  K 4.2  < > 3.9  CL 98  < > 101  CO2 27  < > 26  BUN 5*  < > 10  CREATININE 0.72  < > 0.89  CALCIUM 9.2  < > 9.2  PROT 7.2  --   --   BILITOT 0.4  --   --   ALKPHOS 61  --   --   ALT 25  --   --  AST 56*  --   --   GLUCOSE 106*  < > 104*  < > = values in this interval not displayed.  Recent Labs  01/09/13 1813 01/09/13 2326  TROPONINI 4.37* 4.33*   Lab Results  Component Value Date   CHOL 160 01/11/2013   HDL 51 01/11/2013   LDLCALC 82 01/11/2013   TRIG 135 01/11/2013    Diagnostic Studies/Procedures  Cardiac catheterization this admission, please see full report and above for summary.  Ct Angio Chest W/cm &/or Wo Cm6/08/2012   *RADIOLOGY REPORT*  Clinical Data:  Chest burning pain, numbness and tingling.  CT ANGIOGRAPHY CHEST, ABDOMEN AND PELVIS  Technique:  Multidetector CT imaging through the chest, abdomen and pelvis was  performed using the standard protocol during bolus administration of intravenous contrast.  Multiplanar reconstructed images including MIPs were obtained and reviewed to evaluate the vascular anatomy.  Contrast: OMNIPAQUE IOHEXOL 350 MG/ML SOLN  Comparison:   None.  CTA CHEST  Findings:  The noncontrast scan shows no hyperdense crescent, aneurysm, mediastinal hematoma, pleural or pericardial effusion. Patchy descending thoracic aortic calcifications.  CTA shows normal caliber of the thoracic aorta.  No dissection, aneurysm, or stenosis.  There is classic three-vessel brachiocephalic arterial origin anatomy without proximal stenosis. There is fairly good contrast opacification of central pulmonary artery branches; exam was not optimized for detection of pulmonary emboli.  Sub centimeter prevascular, AP window, and right paratracheal lymph nodes.  No hilar adenopathy.  Lungs are clear. Minimal spurring in the lower thoracic spine.  Sternum intact.   Review of the MIP images confirms the above findings.  IMPRESSION:  Negative for thoracic aortic aneurysm, dissection, or other acute abnormality.  CTA ABDOMEN AND PELVIS  Arterial findings: Aorta:                  Moderate atheromatous plaque without dissection, aneurysm, or stenosis.  Celiac axis:            Widely patent, unremarkable distal branching  Superior mesenteric:Patent, classic distal branching anatomy  Left renal:             Single, widely patent.  Right renal:            Single, widely patent.  Inferior mesenteric:Patent.  Left iliac:             Scattered moderate partially calcified plaque through the common iliac and proximal internal iliac arteries.  External iliac widely patent.  No aneurysm, dissection, or stenosis.  Right iliac:            Scattered calcified mild plaque in the common and internal iliac arteries.  No aneurysm, dissection, or stenosis.  Venous findings:  Venous phase imaging was not obtained.  Note made of patent portal vein and  bilateral renal veins.  Review of the MIP images confirms the above findings.  Nonvascular findings: Unremarkable arterial phase evaluation of liver, spleen, adrenal glands, kidneys, pancreas.  Stomach, small bowel, and colon are nondilated.  Normal appendix.  Urinary bladder is nondistended.  Mild prostatic prominence.  No ascites.  No free air.  No adenopathy.  Lumbar spine intact.  IMPRESSION:  1.  No aortic dissection, aneurysm, or other acute abnormality. 2.  Moderate aortoiliac arterial plaque for age.   Original Report Authenticated By: D. Andria Rhein, MD   Dg Chest Port 1 View 01/09/2013   *RADIOLOGY REPORT*  Clinical Data: Chest pain  PORTABLE CHEST - 1 VIEW  Comparison: None.  Findings: Lungs are  clear. No pleural effusion or pneumothorax.  Borderline cardiomegaly.  IMPRESSION: No evidence of acute cardiopulmonary disease.   Original Report Authenticated By: Charline Bills, M.D.   Ct Cta Abd/pel W/cm &/or W/o Cm 01/09/2013   *RADIOLOGY REPORT*  Clinical Data:  Chest burning pain, numbness and tingling.  CT ANGIOGRAPHY CHEST, ABDOMEN AND PELVIS  Technique:  Multidetector CT imaging through the chest, abdomen and pelvis was performed using the standard protocol during bolus administration of intravenous contrast.  Multiplanar reconstructed images including MIPs were obtained and reviewed to evaluate the vascular anatomy.  Contrast: OMNIPAQUE IOHEXOL 350 MG/ML SOLN  Comparison:   None.  CTA CHEST  Findings:  The noncontrast scan shows no hyperdense crescent, aneurysm, mediastinal hematoma, pleural or pericardial effusion. Patchy descending thoracic aortic calcifications.  CTA shows normal caliber of the thoracic aorta.  No dissection, aneurysm, or stenosis.  There is classic three-vessel brachiocephalic arterial origin anatomy without proximal stenosis. There is fairly good contrast opacification of central pulmonary artery branches; exam was not optimized for detection of pulmonary emboli.  Sub  centimeter prevascular, AP window, and right paratracheal lymph nodes.  No hilar adenopathy.  Lungs are clear. Minimal spurring in the lower thoracic spine.  Sternum intact.   Review of the MIP images confirms the above findings.  IMPRESSION:  Negative for thoracic aortic aneurysm, dissection, or other acute abnormality.  CTA ABDOMEN AND PELVIS  Arterial findings: Aorta:                  Moderate atheromatous plaque without dissection, aneurysm, or stenosis.  Celiac axis:            Widely patent, unremarkable distal branching  Superior mesenteric:Patent, classic distal branching anatomy  Left renal:             Single, widely patent.  Right renal:            Single, widely patent.  Inferior mesenteric:Patent.  Left iliac:             Scattered moderate partially calcified plaque through the common iliac and proximal internal iliac arteries.  External iliac widely patent.  No aneurysm, dissection, or stenosis.  Right iliac:            Scattered calcified mild plaque in the common and internal iliac arteries.  No aneurysm, dissection, or stenosis.  Venous findings:  Venous phase imaging was not obtained.  Note made of patent portal vein and bilateral renal veins.  Review of the MIP images confirms the above findings.  Nonvascular findings: Unremarkable arterial phase evaluation of liver, spleen, adrenal glands, kidneys, pancreas.  Stomach, small bowel, and colon are nondilated.  Normal appendix.  Urinary bladder is nondistended.  Mild prostatic prominence.  No ascites.  No free air.  No adenopathy.  Lumbar spine intact.  IMPRESSION:  1.  No aortic dissection, aneurysm, or other acute abnormality. 2.  Moderate aortoiliac arterial plaque for age.   Original Report Authenticated By: D. Andria Rhein, MD    Discharge Medications     Medication List    TAKE these medications       albuterol 108 (90 BASE) MCG/ACT inhaler  Commonly known as:  PROVENTIL HFA;VENTOLIN HFA  Inhale 2 puffs into the lungs every 6 (six)  hours as needed for wheezing or shortness of breath.     ALPRAZolam 1 MG tablet  Commonly known as:  XANAX  Take 1 mg by mouth 2 (two) times daily as needed for sleep (anxiety).  ALPRAZolam 1 MG 24 hr tablet  Commonly known as:  XANAX XR  Take 1 mg by mouth 2 (two) times daily as needed (anxiety).     amLODipine 5 MG tablet  Commonly known as:  NORVASC  Take 1 tablet (5 mg total) by mouth daily.     aspirin 81 MG EC tablet  Take 1 tablet (81 mg total) by mouth daily.     famotidine 20 MG tablet  Commonly known as:  PEPCID  Take 20 mg by mouth 2 (two) times daily.     lisinopril 10 MG tablet  Commonly known as:  PRINIVIL,ZESTRIL  Take 1 tablet (10 mg total) by mouth daily.     metoprolol tartrate 25 MG tablet  Commonly known as:  LOPRESSOR  Take 1 tablet (25 mg total) by mouth 2 (two) times daily.     nitroGLYCERIN 0.4 MG SL tablet  Commonly known as:  NITROSTAT  Place 1 tablet (0.4 mg total) under the tongue every 5 (five) minutes as needed for chest pain (up to 3 doses).     prasugrel 10 MG Tabs  Commonly known as:  EFFIENT  Take 1 tablet (10 mg total) by mouth daily.     rosuvastatin 10 MG tablet  Commonly known as:  CRESTOR  Take 1 tablet (10 mg total) by mouth daily.        Disposition   The patient will be discharged in stable condition to home. Discharge Orders   Future Appointments Provider Department Dept Phone   01/20/2013 2:00 PM Hillis Range, MD [facility] Main Office [place]) [phone]   01/20/2013 2:15 PM [facility] Main Office [place]) [phone]   Future Orders Complete By Expires     Diet - low sodium heart healthy  As directed     Discharge instructions  As directed     Comments:      At your follow-up appointment, please ask your provider if you should continue taking Effient even after 1 month. You were given a paper copy 30-day prescription to use with the 30-day free card, and your refills are on a  separate prescription.  Please see your primary care doctor for evaluation of COPD/asthma.    Increase activity slowly  As directed     Comments:      No driving for 1 week. No lifting over 10 lbs for 2 weeks. No sexual activity for 2 weeks. You may return to work on 01/18/13 with the above restrictions. Keep procedure site clean & dry. If you notice increased pain, swelling, bleeding or pus, call/return!  You may shower, but no soaking baths/hot tubs/pools for 1 week.      Follow-up Information   Follow up with Hillis Range, MD. (01/20/13 at 2pm - due to appointment availability, you will see [name] [name] for your first after-hospital visit and labwork. Subsequent visits will likely be with Dr. Elease [name] or his NP/PA.)    Contact information:   [address], [address] [place] [address] [phone]         Duration of Discharge Encounter: Greater than 30 minutes including physician and PA time.  Signed, Dayna Dunn PA-C 01/11/2013, 1:10 PM    Attending Note:   The patient was seen and examined.  Agree with assessment and plan as noted above.  Changes made to the above note as needed.  See my note from earlier today.  [name], [name]., MD, [facility] 01/11/2013, 5:45 PM

## 2013-01-11 NOTE — Progress Notes (Signed)
Pt walked last night and sts his knee is swollen today. Will hold ambulation. Apparently his knee swells periodically from a previous knee injury. Good discussion of risk factor modification including smoking cessation, mod ETOH use, and ex. Pt probably will not be able to do CRPII due to job. Good comprehension and pt is motivated to quit smoking. [id number] Ethelda Chick CES, ACSM 10:30 AM 01/11/2013

## 2013-01-11 NOTE — Telephone Encounter (Signed)
TOC appt made for 6/12 @ 2:00. Pt added to DOD day Dr. Johney Frame. No appts available with Dr. Elease Hashimoto or PA/NP.

## 2013-01-11 NOTE — Care Management Note (Signed)
    Page 1 of 1   01/11/2013     10:21:15 AM   CARE MANAGEMENT NOTE 01/11/2013  Patient:  [name] [name]   Account Number:  [id number]  Date Initiated:  01/10/2013  Documentation initiated by:  Avie Arenas  Subjective/Objective Assessment:   NSTEMI - cardiac cath with PCI  Lives with wife.     Action/Plan:   Anticipated DC Date:  01/12/2013   Anticipated DC Plan:  HOME/SELF CARE      DC Planning Services  CM consult  Medication Assistance      Choice offered to / List presented to:             Status of service:  Completed, signed off Medicare Important Message given?   (If response is "NO", the following Medicare IM given date fields will be blank) Date Medicare IM given:   Date Additional Medicare IM given:    Discharge Disposition:    Per UR Regulation:  Reviewed for med. necessity/level of care/duration of stay  If discussed at Long Length of Stay Meetings, dates discussed:    Comments:  ContactTrooper, [name] [name] Spouse [phone]                [name] [name] Father [phone]  6/3 1013a debbie [name] Altizer rn,bsn gave pt 30days free effient and copay assist card. will give pt primary care resourse list and 2 prescription discount cards., working w card rehab.

## 2013-01-12 NOTE — Telephone Encounter (Signed)
Patient contacted regarding discharge from hospital on 01/11/13.  Patient understands to follow up with provider Dr. Johney Frame on 6/12 at 2:00 at [facility]. Patient understands discharge instructions? Yes Patient understands medications and regiment? Yes Patient understands to bring all medications to this visit? Yes  Patient reports he is feeling well today; denies chest pain or SOB; c/o knee pain from old injury.

## 2013-01-20 ENCOUNTER — Encounter: Payer: Self-pay | Admitting: Internal Medicine

## 2013-01-20 ENCOUNTER — Other Ambulatory Visit (INDEPENDENT_AMBULATORY_CARE_PROVIDER_SITE_OTHER): Payer: 59

## 2013-01-20 ENCOUNTER — Ambulatory Visit (INDEPENDENT_AMBULATORY_CARE_PROVIDER_SITE_OTHER): Payer: 59 | Admitting: Internal Medicine

## 2013-01-20 VITALS — BP 120/70 | HR 80 | Ht 72.0 in | Wt 195.0 lb

## 2013-01-20 DIAGNOSIS — F17200 Nicotine dependence, unspecified, uncomplicated: Secondary | ICD-10-CM

## 2013-01-20 DIAGNOSIS — I214 Non-ST elevation (NSTEMI) myocardial infarction: Secondary | ICD-10-CM

## 2013-01-20 DIAGNOSIS — Z720 Tobacco use: Secondary | ICD-10-CM

## 2013-01-20 DIAGNOSIS — I2510 Atherosclerotic heart disease of native coronary artery without angina pectoris: Secondary | ICD-10-CM

## 2013-01-20 LAB — BASIC METABOLIC PANEL
CO2: 26 mEq/L (ref 19–32)
Calcium: 9.4 mg/dL (ref 8.4–10.5)
Sodium: 136 mEq/L (ref 135–145)

## 2013-01-20 NOTE — Progress Notes (Signed)
Primary Cardiologist:  Dr Toribio Harbour is a 47 y.o. male who presents today for routine followup.  Since his recent hospital discharge, the patient reports doing very well.   He has quit smoking.  His CAD symptoms have improved with medical therapy.  Today, he denies symptoms of palpitations, chest pain, shortness of breath,  lower extremity edema, dizziness, presyncope, or syncope.  The patient is otherwise without complaint today.   Past Medical History  Diagnosis Date  . Screening for chemical poisoning and contamination   . Hypertension   . Tobacco abuse   . Coronary artery disease     a. NSTEMI 01/2013 s/p PTCA to prox RCA occlusion.  Marland Kitchen Anxiety   . Headache(784.0)   . COPD (chronic obstructive pulmonary disease)     ?COPD/asthma 01/2013.  Marland Kitchen HLD (hyperlipidemia)    Past Surgical History  Procedure Laterality Date  . Knee surgery  2009    left knee orthroscopy    Current Outpatient Prescriptions  Medication Sig Dispense Refill  . albuterol (PROVENTIL HFA;VENTOLIN HFA) 108 (90 BASE) MCG/ACT inhaler Inhale 2 puffs into the lungs every 6 (six) hours as needed for wheezing or shortness of breath.  1 Inhaler  1  . ALPRAZolam (XANAX) 1 MG tablet Take 1 mg by mouth 2 (two) times daily as needed for sleep (anxiety).      Marland Kitchen amLODipine (NORVASC) 5 MG tablet Take 1 tablet (5 mg total) by mouth daily.  30 tablet  6  . aspirin EC 81 MG EC tablet Take 1 tablet (81 mg total) by mouth daily.      . famotidine (PEPCID) 20 MG tablet Take 20 mg by mouth 2 (two) times daily.      Marland Kitchen lisinopril (PRINIVIL,ZESTRIL) 10 MG tablet Take 1 tablet (10 mg total) by mouth daily.  30 tablet  6  . metoprolol tartrate (LOPRESSOR) 25 MG tablet Take 1 tablet (25 mg total) by mouth 2 (two) times daily.  60 tablet  6  . nitroGLYCERIN (NITROSTAT) 0.4 MG SL tablet Place 1 tablet (0.4 mg total) under the tongue every 5 (five) minutes as needed for chest pain (up to 3 doses).  25 tablet  4  . prasugrel (EFFIENT) 10 MG  TABS Take 1 tablet (10 mg total) by mouth daily.  30 tablet  6  . rosuvastatin (CRESTOR) 10 MG tablet Take 1 tablet (10 mg total) by mouth daily.  30 tablet  6   No current facility-administered medications for this visit.    Physical Exam: Filed Vitals:   01/20/13 1408  BP: 120/70  Pulse: 80  Height: 6' (1.829 m)  Weight: 195 lb (88.451 kg)  SpO2: 97%    GEN- The patient is well appearing, alert and oriented x 3 today.   Head- normocephalic, atraumatic Eyes-  Sclera clear, conjunctiva pink Ears- hearing intact Oropharynx- clear Lungs- Clear to ausculation bilaterally, normal work of breathing Heart- Regular rate and rhythm, no murmurs, rubs or gallops, PMI not laterally displaced GI- soft, NT, ND, + BS Extremities- no clubbing, cyanosis, or edema  Assessment and Plan:  1. CAD Stable s/p PTCA of the RCA Continue current medical therapy bmet and cbc today  2. Tobacco- I am encouraged that he has quit Follow-up with Dr Elease Hashimoto in 2 months

## 2013-01-20 NOTE — Patient Instructions (Signed)
Your physician recommends that you schedule a follow-up appointment in: 2 months with Dr Melburn Popper   Your physician recommends that you return for lab work today

## 2013-01-20 NOTE — Addendum Note (Signed)
Addended by: Dennis Bast F on: 01/20/2013 02:47 PM   Modules accepted: Orders

## 2013-02-08 MED ORDER — CLOPIDOGREL BISULFATE 75 MG PO TABS: 75.0000 mg | ORAL_TABLET | Freq: Every day | ORAL | Status: DC

## 2013-02-08 NOTE — Telephone Encounter (Signed)
New Prob     Has some questions regarding what medication are necessary to refill for pt. Please call.

## 2013-02-08 NOTE — Telephone Encounter (Signed)
Pt questions if he can stop effient/ per cath note, pt was to remain on it for 1 month. Per Dr Elease Hashimoto he may stop but would prefer he start plavix for a maintenance drug since effient so expensive. Wife agreed to plan.

## 2013-02-15 ENCOUNTER — Telehealth: Payer: Self-pay | Admitting: Cardiovascular Disease

## 2013-02-15 NOTE — Telephone Encounter (Signed)
New Problem:    Patient's wife called in wanting to know if her husband was supposed to continue taking his amLODipine (NORVASC) 5 MG tablet.  Please call back.

## 2013-02-15 NOTE — Telephone Encounter (Signed)
Called patient's wife back and advised to stay on Norvasc 5mg  every day. She will refill medication. States that he does not need any refills sent in at this time.

## 2013-04-22 ENCOUNTER — Ambulatory Visit: Payer: 59 | Admitting: Cardiovascular Disease

## 2013-06-16 ENCOUNTER — Other Ambulatory Visit: Payer: Self-pay

## 2013-11-14 ENCOUNTER — Other Ambulatory Visit (HOSPITAL_COMMUNITY): Payer: Self-pay | Admitting: Physician Assistant

## 2014-01-09 ENCOUNTER — Other Ambulatory Visit (HOSPITAL_COMMUNITY): Payer: Self-pay | Admitting: Cardiovascular Disease

## 2014-01-11 ENCOUNTER — Telehealth: Payer: Self-pay | Admitting: Cardiovascular Disease

## 2014-01-12 ENCOUNTER — Other Ambulatory Visit: Payer: Self-pay | Admitting: *Deleted

## 2014-01-12 MED ORDER — ROSUVASTATIN CALCIUM 10 MG PO TABS: ORAL_TABLET | ORAL | Status: DC

## 2014-01-13 NOTE — Telephone Encounter (Signed)
He may try atorvastatin 40 mg a day

## 2014-01-16 MED ORDER — ATORVASTATIN CALCIUM 40 MG PO TABS: 40.0000 mg | ORAL_TABLET | Freq: Every day | ORAL | Status: DC

## 2014-01-16 NOTE — Telephone Encounter (Signed)
Left pt a message to call back. 

## 2014-01-16 NOTE — Telephone Encounter (Signed)
Called pt's wife to find out if she had called about a statin medication. Wife states had spoken with Rose in refill regarding Crestor being so expensive . Pt wants for Md to prescribed  a chipper medication, something he can afford. Wife is aware that Dr. Acie Fredrickson recommended Atorvastatin 40 mg once a day. Prescription send to [facility]. Wife aware.

## 2014-01-16 NOTE — Telephone Encounter (Signed)
On pt's records it was noted that pt was allergic to Lipitor . I Spoke with pt and pt's wife which states that pt is not allergic to Lipitor, because he never has taken this medication. Lipitor was taken off the allergy list. Atorvastatin   Prescription was send to pharmacy. I was not able to discontinue the Crestor medication from the medication list, because the system did not allowed me to do so.

## 2014-01-23 NOTE — Telephone Encounter (Signed)
Unable to remove Crestor from med list because it is a future order

## 2014-01-30 ENCOUNTER — Other Ambulatory Visit: Payer: Self-pay | Admitting: Nurse Practitioner

## 2014-02-23 ENCOUNTER — Ambulatory Visit: Payer: 59 | Admitting: Cardiovascular Disease

## 2014-07-20 ENCOUNTER — Encounter (HOSPITAL_COMMUNITY): Payer: Self-pay | Admitting: Cardiovascular Disease

## 2017-01-28 ENCOUNTER — Ambulatory Visit (INDEPENDENT_AMBULATORY_CARE_PROVIDER_SITE_OTHER): Payer: 59 | Admitting: Orthopedic Surgery

## 2017-01-28 ENCOUNTER — Encounter (INDEPENDENT_AMBULATORY_CARE_PROVIDER_SITE_OTHER): Payer: Self-pay | Admitting: Orthopedic Surgery

## 2017-01-28 DIAGNOSIS — M25462 Effusion, left knee: Secondary | ICD-10-CM

## 2017-01-28 DIAGNOSIS — M25562 Pain in left knee: Secondary | ICD-10-CM | POA: Diagnosis not present

## 2017-01-28 MED ORDER — BUPIVACAINE HCL 0.25 % IJ SOLN
4.0000 mL | INTRAMUSCULAR | Status: AC | PRN
  Administered 2017-01-28: 4 mL via INTRA_ARTICULAR

## 2017-01-28 MED ORDER — LIDOCAINE HCL 1 % IJ SOLN
5.0000 mL | INTRAMUSCULAR | Status: AC | PRN
  Administered 2017-01-28: 5 mL

## 2017-01-28 NOTE — Progress Notes (Signed)
Office Visit Note   Patient: [name] [name]           Date of Birth: May 18, 1966           MRN: [id number] Visit Date: 01/28/2017 Requested by: No referring provider defined for this encounter. PCP: System, Pcp Not In  Subjective: Chief Complaint  Patient presents with  . Left Knee - Pain    HPI: Chavez is a patient with left knee pain.  He's here for second opinion regarding his knee.  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  Describes swelling and constant pain in the left knee with weakness giving way locking.  Had arthroscopy on the knee in 2010.  He had aspiration of the knee-pros several weeks ago where about 50 mL of fluid was aspirated.  A cortisone injection performed May 31 without much relief.  Has had an MRI scan which is reviewed.  It shows mild undersurface fraying of the medial meniscus and effusion synovitis but significant structural damage to the articular surfaces for the anterior cruciate ligament or PCL.  He denies any fever and chills.  Denies much in the way of other joint complaints.              ROS: All systems reviewed are negative as they relate to the chief complaint within the history of present illness.  Patient denies  fevers or chills.   Assessment & Plan: Visit Diagnoses:  1. Acute pain of left knee   2. Effusion of left knee joint     Plan: Impression is acute left knee pain which is significantly more disabling than it would've.  To be on the MRI scan.  He has synovitis and effusion that left knee with not much in the way of structural abnormality.  He does have some undersurface fraying of the medial meniscus but it's a fairly small findings seen best on the coronal views.  On aspirate the knee today and we did get about 35 mL.  Slightly cloudy tented so we'll send this for Gram stain and cell count aerobic and anaerobic culture and crystals.  Also want to draw on him blood work wise CBC  differential sedimentation rate C-reactive protein uric acid rheumatoid factor as well as anti-CCP antibody.  I'll call him with those results and we'll see how he does with the aspiration and Toradol injection into the knee.  Follow-Up Instructions: Return if symptoms worsen or fail to improve.   Orders:  Orders Placed This Encounter  Procedures  . Body Fluid Culture  . CBC with Differential  . Sed Rate (ESR)  . C-reactive protein  . Uric acid  . Rheumatoid factor  . Cyclic citrul peptide antibody, IgG  . Cell count + diff,  w/ cryst-synvl fld   No orders of the defined types were placed in this encounter.     Procedures: Large Joint Inj Date/Time: 01/28/2017 11:09 PM Performed by: Meredith Pel Authorized by: Meredith Pel   Consent Given by:  Patient Site marked: the procedure site was marked   Timeout: prior to procedure the correct patient, procedure, and site was verified   Indications:  Pain, joint swelling and diagnostic evaluation Location:  Knee Site:  L knee Prep: patient was prepped and draped in usual sterile fashion   Needle Size:  18 G Needle Length:  1.5 inches Approach:  Superolateral Ultrasound Guidance: No   Fluoroscopic Guidance: No  Arthrogram: No   Medications:  5 mL lidocaine 1 %; 4 mL bupivacaine 0.25 % Patient tolerance:  Patient tolerated the procedure well with no immediate complications  Toradol injected into the knee     Clinical Data: No additional findings.  Objective: Vital Signs: There were no vitals taken for this visit.  Physical Exam:   Constitutional: Patient appears well-developed HEENT:  Head: Normocephalic Eyes:EOM are normal Neck: Normal range of motion Cardiovascular: Normal rate Pulmonary/chest: Effort normal Neurologic: Patient is alert Skin: Skin is warm Psychiatric: Patient has normal mood and affect    Ortho Exam: Orthopedic exam demonstrates antalgic gait to the left.  He has an inability to  achieve full extension.  Pedal pulses palpable.  There is no proximal lymphadenopathy.  Moderate effusion is present.  Flexion is to about 80.  The knee itself is warm compared to the right-hand side.  Specialty Comments:  No specialty comments available.  Imaging: No results found.   PMFS History: Patient Active Problem List   Diagnosis Date Noted  . Tobacco abuse 01/20/2013  . CAD (coronary artery disease) 01/11/2013   Past Medical History:  Diagnosis Date  . Anxiety   . COPD (chronic obstructive pulmonary disease) ([place])    ?COPD/asthma 01/2013.  Marland Kitchen Coronary artery disease    a. NSTEMI 01/2013 s/p PTCA to prox RCA occlusion.  Marland Kitchen Headache(784.0)   . HLD (hyperlipidemia)   . Hypertension   . Screening for chemical poisoning and contamination   . Tobacco abuse     Family History  Problem Relation Age of Onset  . CAD Brother 71    Past Surgical History:  Procedure Laterality Date  . KNEE SURGERY  2009   left knee orthroscopy  . LEFT HEART CATHETERIZATION WITH CORONARY ANGIOGRAM N/A 01/10/2013   Procedure: LEFT HEART CATHETERIZATION WITH CORONARY ANGIOGRAM;  Surgeon: Thayer Headings, MD;  Location: [facility] CATH LAB;  Service: Cardiovascular;  Laterality: N/A;   Social History   Occupational History  . Not on file.   Social History Main Topics  . Smoking status: Former Smoker    Packs/day: 1.50    Years: 15.00    Types: Cigarettes    Quit date: 01/09/2013  . Smokeless tobacco: Never Used  . Alcohol use 9.6 oz/week    16 Cans of beer per week     Comment: 16 beer per night times 5 years  . Drug use: No  . Sexual activity: Yes

## 2017-01-29 LAB — CYCLIC CITRUL PEPTIDE ANTIBODY, IGG: Cyclic Citrullin Peptide Ab: <16 Units

## 2017-01-29 LAB — SYNOVIAL CELL COUNT + DIFF, W/ CRYSTALS
Basophils, %: 0 %
EOSINOPHILS-SYNOVIAL: 0 % (ref 0–2)
LYMPHOCYTES-SYNOVIAL FLD: 1 % (ref 0–74)
MONOCYTE/MACROPHAGE: 13 % (ref 0–69)
NEUTROPHIL, SYNOVIAL: 86 % — AB (ref 0–24)
SYNOVIOCYTES, %: 0 % (ref 0–15)
WBC, SYNOVIAL: 3160 {cells}/uL — AB (ref <150)

## 2017-01-29 LAB — CBC WITH DIFFERENTIAL/PLATELET
BASOS PCT: 1 %
Basophils Absolute: 105 cells/uL (ref 0–200)
EOS ABS: 210 {cells}/uL (ref 15–500)
Eosinophils Relative: 2 %
HCT: 49.4 % (ref 38.5–50.0)
Hemoglobin: 17 g/dL (ref 13.2–17.1)
LYMPHS PCT: 27 %
Lymphs Abs: 2835 cells/uL (ref 850–3900)
MCH: 34.1 pg — ABNORMAL HIGH (ref 27.0–33.0)
MCHC: 34.4 g/dL (ref 32.0–36.0)
MCV: 99 fL (ref 80.0–100.0)
MONOS PCT: 6 %
MPV: 9.6 fL (ref 7.5–12.5)
Monocytes Absolute: 630 cells/uL (ref 200–950)
NEUTROS PCT: 64 %
Neutro Abs: 6720 cells/uL (ref 1500–7800)
PLATELETS: 239 10*3/uL (ref 140–400)
RBC: 4.99 MIL/uL (ref 4.20–5.80)
RDW: 13.8 % (ref 11.0–15.0)
WBC: 10.5 10*3/uL (ref 3.8–10.8)

## 2017-01-29 LAB — URIC ACID: URIC ACID, SERUM: 7.3 mg/dL (ref 4.0–8.0)

## 2017-01-29 LAB — SEDIMENTATION RATE: Sed Rate: 1 mm/hr (ref 0–15)

## 2017-01-29 LAB — C-REACTIVE PROTEIN: CRP: 14.2 mg/L — ABNORMAL HIGH (ref <8.0)

## 2017-01-29 LAB — RHEUMATOID FACTOR

## 2017-02-02 LAB — BODY FLUID CULTURE
GRAM STAIN: NONE SEEN
GRAM STAIN: NONE SEEN
ORGANISM ID, BACTERIA: NO GROWTH

## 2017-02-03 ENCOUNTER — Other Ambulatory Visit (INDEPENDENT_AMBULATORY_CARE_PROVIDER_SITE_OTHER): Payer: Self-pay

## 2017-02-03 DIAGNOSIS — M1A0690 Idiopathic chronic gout, unspecified knee, without tophus (tophi): Secondary | ICD-10-CM

## 2017-02-03 MED ORDER — COLCHICINE 0.6 MG PO TABS: 0.6000 mg | ORAL_TABLET | Freq: Every day | ORAL | 1 refills | Status: DC

## 2017-02-03 NOTE — Progress Notes (Signed)
I think you called him

## 2017-02-04 ENCOUNTER — Telehealth (INDEPENDENT_AMBULATORY_CARE_PROVIDER_SITE_OTHER): Payer: Self-pay | Admitting: Orthopedic Surgery

## 2017-02-04 NOTE — Telephone Encounter (Signed)
IC advised ok to change so insurance would cover.

## 2017-02-04 NOTE — Telephone Encounter (Signed)
Stephanie from [name] called asking for the colchicine to be sent over again but for the capsule form so his insurance will cover it. Thank you! CB # [phone]

## 2017-04-06 DIAGNOSIS — M1A0620 Idiopathic chronic gout, left knee, without tophus (tophi): Secondary | ICD-10-CM | POA: Insufficient documentation

## 2017-04-06 NOTE — Progress Notes (Signed)
Office Visit Note  Patient: [name] [name]             Date of Birth: 1966/03/27           MRN: [id number]             PCP: System, Pcp Not In Referring: Meredith Pel, MD Visit Date: 04/08/2017 Occupation: Heat and air technician    Subjective:  Joint pain.   History of Present Illness: [name] [name] is a 51 y.o. male seen in consultation per request of Dr. Marlou Sa. According to patient his symptoms started about 12 years ago in his right first toe. He states he will have episodes of gout which will last for about a week. In the last year the symptoms have become more frequent. He had left knee joint injury at work a few years back. He states he was having recurrent effusions in his left knee joint approximately twice a year in the last few months he has had 5 flares at the time his knee joint was aspirated. During the last aspiration the synovial fluid revealed uric acid. He was started on colchicine. He states he finished his colchicine and he did quite well while he was on the medication. He complains of discomfort in his feet knee joints and his elbow joints. He denies any history of tophus. There is no family history of gout. He states he used to drink alcohol on a regular basis but he has cut down on the alcohol intake now. He drinks about 12 beers per week. He is also had some intentional weight loss of about 30 pounds over the last few months. He does eat red meat and occasional selfish.   Activities of Daily Living:  Patient reports morning stiffness for 5 minutes.   Patient Denies nocturnal pain.  Difficulty dressing/grooming: Denies Difficulty climbing stairs: Reports Difficulty getting out of chair: Denies Difficulty using hands for taps, buttons, cutlery, and/or writing: Denies   Review of Systems  Constitutional: Negative for fatigue, night sweats and weakness ( ).  HENT: Negative for mouth sores, mouth dryness and nose dryness.   Eyes: Negative for redness and dryness.    Respiratory: Negative for shortness of breath and difficulty breathing.   Cardiovascular: Positive for hypertension. Negative for chest pain, palpitations, irregular heartbeat and swelling in legs/feet.  Gastrointestinal: Negative for constipation and diarrhea.  Endocrine: Negative for increased urination.  Musculoskeletal: Positive for arthralgias, joint pain, joint swelling and morning stiffness. Negative for myalgias, muscle weakness, muscle tenderness and myalgias.  Skin: Negative for color change, rash, hair loss, nodules/bumps, skin tightness, ulcers and sensitivity to sunlight.  Allergic/Immunologic: Negative for susceptible to infections.  Neurological: Negative for dizziness, fainting, memory loss and night sweats.  Hematological: Negative for swollen glands.  Psychiatric/Behavioral: Negative for depressed mood and sleep disturbance. The patient is nervous/anxious.     PMFS History:  Patient Active Problem List   Diagnosis Date Noted  . Idiopathic chronic gout of left knee without tophus 04/06/2017  . Tobacco abuse 01/20/2013  . CAD (coronary artery disease) 01/11/2013    Past Medical History:  Diagnosis Date  . Anxiety   . COPD (chronic obstructive pulmonary disease) ([place])    ?COPD/asthma 01/2013.  Marland Kitchen Coronary artery disease    a. NSTEMI 01/2013 s/p PTCA to prox RCA occlusion.  Marland Kitchen Headache(784.0)   . HLD (hyperlipidemia)   . Hypertension   . Screening for chemical poisoning and contamination   . Tobacco abuse     Family  History  Problem Relation Age of Onset  . CAD Brother 40   Past Surgical History:  Procedure Laterality Date  . KNEE SURGERY  2009   left knee orthroscopy  . LEFT HEART CATHETERIZATION WITH CORONARY ANGIOGRAM N/A 01/10/2013   Procedure: LEFT HEART CATHETERIZATION WITH CORONARY ANGIOGRAM;  Surgeon: Thayer Headings, MD;  Location: [facility] CATH LAB;  Service: Cardiovascular;  Laterality: N/A;   Social History   Social History Narrative  . No narrative on  file     Objective: Vital Signs: BP (!) 152/84 (BP Location: Right Arm)   Pulse 80   Resp 14   Ht 6' (1.829 m)   Wt 193 lb (87.5 kg)   BMI 26.18 kg/m    Physical Exam  Constitutional: He is oriented to person, place, and time. He appears well-developed and well-nourished.  HENT:  Head: Normocephalic and atraumatic.  Eyes: Pupils are equal, round, and reactive to light. Conjunctivae and EOM are normal.  Neck: Normal range of motion. Neck supple.  Cardiovascular: Normal rate, regular rhythm and normal heart sounds.   Pulmonary/Chest: Effort normal and breath sounds normal.  Abdominal: Soft. Bowel sounds are normal.  Neurological: He is alert and oriented to person, place, and time.  Skin: Skin is warm and dry. Capillary refill takes less than 2 seconds.  Psychiatric: He has a normal mood and affect. His behavior is normal.  Nursing note and vitals reviewed.    Musculoskeletal Exam: C-spine and thoracic lumbar spine good range of motion. Shoulder joints although joints wrist joint MCPs PIPs DIPs are good range of motion. He has some DIP PIP thickening in his hands consistent with osteoarthritis but no synovitis was noted. Hip joints knee joints ankles MTPs PIPs DIPs are good range of motion without any warmth swelling or effusion. He had some discomfort across his MTP joints.  CDAI Exam: No CDAI exam completed.    Investigation: Findings:   01/28/2017 C-reactive protein  14.2 High ,CBC with Differential normal, Cell count + diff, w/ cryst-synvl fld Color, Synovial Positive for uric acid crystals,body Fluid Culture NO GROWTH ,Cyclic citrul peptide antibody, IgG normal, Rheumatoid factor normal ,Sed Rate (ESR) normal, and Uric acid 7.3    Imaging: Xr Foot 2 Views Left  Result Date: 04/08/2017 PIP/DIP narrowing was noted. Mild first MTP narrowing was noted. No erosive changes were noted. No intertarsal joint space narrowing was noted. Small calcaneal spurs were noted. Impression:  These findings are consistent with osteoarthritis of the foot.  Xr Foot 2 Views Right  Result Date: 04/08/2017 PIP/DIP narrowing was noted. Mild first MTP narrowing was noted. No erosive changes were noted. No intertarsal joint space narrowing was noted. Small calcaneal spurs were noted. Impression: These findings are consistent with osteoarthritis of the foot.  Xr Hand 2 View Left  Result Date: 04/08/2017 Minimal PIP/DIP narrowing was noted. No MCP joint narrowing or erosive changes were noted. No intercarpal joint space narrowing was noted. Impression mild osteoarthritis of the hand.  Xr Hand 2 View Right  Result Date: 04/08/2017 Minimal PIP/DIP narrowing was noted. No MCP joint narrowing or erosive changes were noted. No intercarpal joint space narrowing was noted. Impression mild osteoarthritis of the hand.  Xr Knee 3 View Left  Result Date: 04/08/2017 Moderate medial compartment narrowing no chondrocalcinosis mild patellofemoral narrowing was noted. Impression: These findings are consistent with moderate osteoarthritis and mild chondromalacia patella.  Xr Knee 3 View Right  Result Date: 04/08/2017 Moderate medial compartment narrowing no chondrocalcinosis mild patellofemoral narrowing  was noted. Impression: These findings are consistent with moderate osteoarthritis and mild chondromalacia patella.   Speciality Comments: No specialty comments available.    Procedures:  No procedures performed Allergies: Patient has no known allergies.   Assessment / Plan:     Visit Diagnoses: Idiopathic chronic gout of left knee without tophus - Crystal proven, uric acid 7.11 January 2017, on Colchicine - patient has done well on colchicine. He states he ran out of his colchicine. Detailed counseling regarding gout was provided. Medication management of gout and dietary management of gout was discussed at length. He's been having some discomfort in his bilateral hands, bilateral feet in bilateral knee  joints. I'll obtain following x-rays and the labs today. The plan is to start him on Medicare 0.6 mg by mouth daily. A week later we will add allopurinol 100 mg a day for 2 weeks and then increase it to 200 mg by mouth daily after 2 weeks I would like to increase it to 300 mg a day. We will check labs at the end of one month, 2 months and then every 6 months. Plan: XR Foot 2 Views Left, XR Foot 2 Views Right, XR KNEE 3 VIEW LEFT, XR KNEE 3 VIEW RIGHT, XR Hand 2 View Left, XR Hand 2 View Right, COMPLETE METABOLIC PANEL WITH GFR, COMPLETE METABOLIC PANEL WITH GFR, CBC with Differential/Platelet, Uric acid, CBC with Differential/Platelet, COMPLETE METABOLIC PANEL WITH GFR, Uric acid  Effusion of left knee - Synovial fluid positive for uric acid crystals - Plan: XR KNEE 3 VIEW LEFT. X-ray showed moderate osteoarthritis and mild chondromalacia patella.  Bilateral hand pain - Plan: XR Hand 2 View Left, XR Hand 2 View Right. X-ray showed only mild osteoarthritic changes.  Pain in both feet - Plan: XR Foot 2 Views Left, XR Foot 2 Views Right. X-ray shows mild osteoarthritic changes in  feet.  Chronic pain of both knees - Plan: XR KNEE 3 VIEW LEFT, XR KNEE 3 VIEW RIGHT  High risk medication use - Plan: COMPLETE METABOLIC PANEL WITH GFR, COMPLETE METABOLIC PANEL WITH GFR, CBC with Differential/Platelet, Uric acid, CBC with Differential/Platelet, COMPLETE METABOLIC PANEL WITH GFR, Uric acid  Essential hypertension  Dyslipidemia  History of coronary artery disease - On Plavix  History of gastroesophageal reflux (GERD)  Tobacco abuse: smoking cessation was discussed at length.  Alcohol intake: He drinks about 12 beers per week. Association of alcohol intake with gout was also discussed at length.   Orders: Orders Placed This Encounter  Procedures  . XR Foot 2 Views Left  . XR Foot 2 Views Right  . XR KNEE 3 VIEW LEFT  . XR KNEE 3 VIEW RIGHT  . XR Hand 2 View Left  . XR Hand 2 View Right  .  COMPLETE METABOLIC PANEL WITH GFR  . COMPLETE METABOLIC PANEL WITH GFR  . CBC with Differential/Platelet  . Uric acid  . CBC with Differential/Platelet  . COMPLETE METABOLIC PANEL WITH GFR  . Uric acid   Meds ordered this encounter  Medications  . allopurinol (ZYLOPRIM) 100 MG tablet    Sig: One tablet qd x2weeks, two tablets qd x 2weeks    Dispense:  42 tablet    Refill:  0  . MITIGARE 0.6 MG CAPS    Sig: Take 1 capsule by mouth daily.    Dispense:  30 capsule    Refill:  2  . allopurinol (ZYLOPRIM) 300 MG tablet    Sig: Take 1 tablet (300 mg total)  by mouth daily.    Dispense:  30 tablet    Refill:  1    Use after 100 mg tablets are gone    Face-to-face time spent with patient was 45 minutes. Greater than 50% of time was spent in counseling and coordination of care.  Follow-Up Instructions: Return for Gout.   Bo Merino, MD  Note - This record has been created using Editor, commissioning.  Chart creation errors have been sought, but may not always  have been located. Such creation errors do not reflect on  the standard of medical care.

## 2017-04-08 ENCOUNTER — Ambulatory Visit (INDEPENDENT_AMBULATORY_CARE_PROVIDER_SITE_OTHER): Payer: Self-pay

## 2017-04-08 ENCOUNTER — Encounter: Payer: Self-pay | Admitting: Rheumatology

## 2017-04-08 ENCOUNTER — Ambulatory Visit (INDEPENDENT_AMBULATORY_CARE_PROVIDER_SITE_OTHER): Payer: 59

## 2017-04-08 ENCOUNTER — Ambulatory Visit (INDEPENDENT_AMBULATORY_CARE_PROVIDER_SITE_OTHER): Payer: 59 | Admitting: Rheumatology

## 2017-04-08 VITALS — BP 152/84 | HR 80 | Resp 14 | Ht 72.0 in | Wt 193.0 lb

## 2017-04-08 DIAGNOSIS — M79671 Pain in right foot: Secondary | ICD-10-CM | POA: Diagnosis not present

## 2017-04-08 DIAGNOSIS — M25462 Effusion, left knee: Secondary | ICD-10-CM

## 2017-04-08 DIAGNOSIS — M1A0620 Idiopathic chronic gout, left knee, without tophus (tophi): Secondary | ICD-10-CM

## 2017-04-08 DIAGNOSIS — M25562 Pain in left knee: Secondary | ICD-10-CM

## 2017-04-08 DIAGNOSIS — M79672 Pain in left foot: Secondary | ICD-10-CM

## 2017-04-08 DIAGNOSIS — Z8719 Personal history of other diseases of the digestive system: Secondary | ICD-10-CM

## 2017-04-08 DIAGNOSIS — Z8679 Personal history of other diseases of the circulatory system: Secondary | ICD-10-CM | POA: Diagnosis not present

## 2017-04-08 DIAGNOSIS — I10 Essential (primary) hypertension: Secondary | ICD-10-CM | POA: Diagnosis not present

## 2017-04-08 DIAGNOSIS — M25561 Pain in right knee: Secondary | ICD-10-CM | POA: Diagnosis not present

## 2017-04-08 DIAGNOSIS — Z79899 Other long term (current) drug therapy: Secondary | ICD-10-CM | POA: Diagnosis not present

## 2017-04-08 DIAGNOSIS — M79641 Pain in right hand: Secondary | ICD-10-CM | POA: Diagnosis not present

## 2017-04-08 DIAGNOSIS — Z720 Tobacco use: Secondary | ICD-10-CM | POA: Diagnosis not present

## 2017-04-08 DIAGNOSIS — E785 Hyperlipidemia, unspecified: Secondary | ICD-10-CM

## 2017-04-08 DIAGNOSIS — M79642 Pain in left hand: Secondary | ICD-10-CM | POA: Diagnosis not present

## 2017-04-08 DIAGNOSIS — G8929 Other chronic pain: Secondary | ICD-10-CM

## 2017-04-08 MED ORDER — ALLOPURINOL 300 MG PO TABS: 300.0000 mg | ORAL_TABLET | Freq: Every day | ORAL | 1 refills | Status: DC

## 2017-04-08 MED ORDER — ALLOPURINOL 100 MG PO TABS: ORAL_TABLET | ORAL | 0 refills | Status: DC

## 2017-04-08 MED ORDER — MITIGARE 0.6 MG PO CAPS: 1.0000 | ORAL_CAPSULE | Freq: Every day | ORAL | 2 refills | Status: DC

## 2017-04-08 NOTE — Patient Instructions (Addendum)
Take Mitigare (colchicine ) for a week, then start the Allopurinol, you will increase the Allopurinol until you are at 300mg  dose. You will start with 100mg  then increase to 200 mg, then 300mg .     Low-Purine Diet Purines are compounds that affect the level of uric acid in your body. A low-purine diet is a diet that is low in purines. Eating a low-purine diet can prevent the level of uric acid in your body from getting too high and causing gout or kidney stones or both. What do I need to know about this diet?  Choose low-purine foods. Examples of low-purine foods are listed in the next section.  Drink plenty of fluids, especially water. Fluids can help remove uric acid from your body. Try to drink 8-16 cups (1.9-3.8 L) a day.  Limit foods high in fat, especially saturated fat, as fat makes it harder for the body to get rid of uric acid. Foods high in saturated fat include pizza, cheese, ice cream, whole milk, fried foods, and gravies. Choose foods that are lower in fat and lean sources of protein. Use olive oil when cooking as it contains healthy fats that are not high in saturated fat.  Limit alcohol. Alcohol interferes with the elimination of uric acid from your body. If you are having a gout attack, avoid all alcohol.  Keep in mind that different people's bodies react differently to different foods. You will probably learn over time which foods do or do not affect you. If you discover that a food tends to cause your gout to flare up, avoid eating that food. You can more freely enjoy foods that do not cause problems. If you have any questions about a food item, talk to your dietitian or health care provider. Which foods are low, moderate, and high in purines? The following is a list of foods that are low, moderate, and high in purines. You can eat any amount of the foods that are low in purines. You may be able to have small amounts of foods that are moderate in purines. Ask your health care  provider how much of a food moderate in purines you can have. Avoid foods high in purines. Grains  Foods low in purines: Enriched white bread, pasta, rice, cake, cornbread, popcorn.  Foods moderate in purines: Whole-grain breads and cereals, wheat germ, bran, oatmeal. Uncooked oatmeal. Dry wheat bran or wheat germ.  Foods high in purines: Pancakes, [place] toast, biscuits, muffins. Vegetables  Foods low in purines: All vegetables, except those that are moderate in purines.  Foods moderate in purines: Asparagus, cauliflower, spinach, mushrooms, green peas. Fruits  All fruits are low in purines. Meats and other Protein Foods  Foods low in purines: Eggs, nuts, peanut butter.  Foods moderate in purines: 80-90% lean beef, lamb, veal, pork, poultry, fish, eggs, peanut butter, nuts. Crab, lobster, oysters, and shrimp. Cooked dried beans, peas, and lentils.  Foods high in purines: Anchovies, sardines, herring, mussels, tuna, codfish, scallops, trout, and haddock. Berniece Salines. Organ meats (such as liver or kidney). Tripe. Game meat. Goose. Sweetbreads. Dairy  All dairy foods are low in purines. Low-fat and fat-free dairy products are best because they are low in saturated fat. Beverages  Drinks low in purines: Water, carbonated beverages, tea, coffee, cocoa.  Drinks moderate in purines: Soft drinks and other drinks sweetened with high-fructose corn syrup. Juices. To find whether a food or drink is sweetened with high-fructose corn syrup, look at the ingredients list.  Drinks high in purines:  Alcoholic beverages (such as beer). Condiments  Foods low in purines: Salt, herbs, olives, pickles, relishes, vinegar.  Foods moderate in purines: Butter, margarine, oils, mayonnaise. Fats and Oils  Foods low in purines: All types, except gravies and sauces made with meat.  Foods high in purines: Gravies and sauces made with meat. Other Foods  Foods low in purines: Sugars, sweets, gelatin. Cake.  Soups made without meat.  Foods moderate in purines: Meat-based or fish-based soups, broths, or bouillons. Foods and drinks sweetened with high-fructose corn syrup.  Foods high in purines: High-fat desserts (such as ice cream, cookies, cakes, pies, doughnuts, and chocolate). Contact your dietitian for more information on foods that are not listed here. This information is not intended to replace advice given to you by your health care provider. Make sure you discuss any questions you have with your health care provider. Document Released: 11/22/2010 Document Revised: 01/03/2016 Document Reviewed: 07/04/2013 Elsevier Interactive Patient Education  2017 [place]. Gout Gout is painful swelling that can occur in some of your joints. Gout is a type of arthritis. This condition is caused by having too much uric acid in your body. Uric acid is a chemical that forms when your body breaks down substances called purines. Purines are important for building body proteins. When your body has too much uric acid, sharp crystals can form and build up inside your joints. This causes pain and swelling. Gout attacks can happen quickly and be very painful (acute gout). Over time, the attacks can affect more joints and become more frequent (chronic gout). Gout can also cause uric acid to build up under your skin and inside your kidneys. What are the causes? This condition is caused by too much uric acid in your blood. This can occur because:  Your kidneys do not remove enough uric acid from your blood. This is the most common cause.  Your body makes too much uric acid. This can occur with some cancers and cancer treatments. It can also occur if your body is breaking down too many red blood cells (hemolytic anemia).  You eat too many foods that are high in purines. These foods include organ meats and some seafood. Alcohol, especially beer, is also high in purines.  A gout attack may be triggered by trauma or  stress. What increases the risk? This condition is more likely to develop in people who:  Have a family history of gout.  Are male and middle-aged.  Are male and have gone through menopause.  Are obese.  Frequently drink alcohol, especially beer.  Are dehydrated.  Lose weight too quickly.  Have an organ transplant.  Have lead poisoning.  Take certain medicines, including aspirin, cyclosporine, diuretics, levodopa, and niacin.  Have kidney disease or psoriasis.  What are the signs or symptoms? An attack of acute gout happens quickly. It usually occurs in just one joint. The most common place is the big toe. Attacks often start at night. Other joints that may be affected include joints of the feet, ankle, knee, fingers, wrist, or elbow. Symptoms may include:  Severe pain.  Warmth.  Swelling.  Stiffness.  Tenderness. The affected joint may be very painful to touch.  Shiny, red, or purple skin.  Chills and fever.  Chronic gout may cause symptoms more frequently. More joints may be involved. You may also have white or yellow lumps (tophi) on your hands or feet or in other areas near your joints. How is this diagnosed? This condition is diagnosed based on  your symptoms, medical history, and physical exam. You may have tests, such as:  Blood tests to measure uric acid levels.  Removal of joint fluid with a needle (aspiration) to look for uric acid crystals.  X-rays to look for joint damage.  How is this treated? Treatment for this condition has two phases: treating an acute attack and preventing future attacks. Acute gout treatment may include medicines to reduce pain and swelling, including:  NSAIDs.  Steroids. These are strong anti-inflammatory medicines that can be taken by mouth (orally) or injected into a joint.  Colchicine. This medicine relieves pain and swelling when it is taken soon after an attack. It can be given orally or through an IV  tube.  Preventive treatment may include:  Daily use of smaller doses of NSAIDs or colchicine.  Use of a medicine that reduces uric acid levels in your blood.  Changes to your diet. You may need to see a specialist about healthy eating (dietitian).  Follow these instructions at home: During a Gout Attack  If directed, apply ice to the affected area: ? Put ice in a plastic bag. ? Place a towel between your skin and the bag. ? Leave the ice on for 20 minutes, 2-3 times a day.  Rest the joint as much as possible. If the affected joint is in your leg, you may be given crutches to use.  Raise (elevate) the affected joint above the level of your heart as often as possible.  Drink enough fluids to keep your urine clear or pale yellow.  Take over-the-counter and prescription medicines only as told by your health care provider.  Do not drive or operate heavy machinery while taking prescription pain medicine.  Follow instructions from your health care provider about eating or drinking restrictions.  Return to your normal activities as told by your health care provider. Ask your health care provider what activities are safe for you. Avoiding Future Gout Attacks  Follow a low-purine diet as told by your dietitian or health care provider. Avoid foods and drinks that are high in purines, including liver, kidney, anchovies, asparagus, herring, mushrooms, mussels, and beer.  Limit alcohol intake to no more than 1 drink a day for nonpregnant women and 2 drinks a day for men. One drink equals 12 oz of beer, 5 oz of wine, or 1 oz of hard liquor.  Maintain a healthy weight or lose weight if you are overweight. If you want to lose weight, talk with your health care provider. It is important that you do not lose weight too quickly.  Start or maintain an exercise program as told by your health care provider.  Drink enough fluids to keep your urine clear or pale yellow.  Take over-the-counter and  prescription medicines only as told by your health care provider.  Keep all follow-up visits as told by your health care provider. This is important. Contact a health care provider if:  You have another gout attack.  You continue to have symptoms of a gout attack after10 days of treatment.  You have side effects from your medicines.  You have chills or a fever.  You have burning pain when you urinate.  You have pain in your lower back or belly. Get help right away if:  You have severe or uncontrolled pain.  You cannot urinate. This information is not intended to replace advice given to you by your health care provider. Make sure you discuss any questions you have with your health care  provider. Document Released: 07/25/2000 Document Revised: 01/03/2016 Document Reviewed: 05/10/2015 Elsevier Interactive Patient Education  2017 [employer].

## 2017-04-09 LAB — COMPLETE METABOLIC PANEL WITH GFR
ALBUMIN: 4.7 g/dL (ref 3.6–5.1)
ALK PHOS: 54 U/L (ref 40–115)
ALT: 19 U/L (ref 9–46)
AST: 24 U/L (ref 10–40)
BILIRUBIN TOTAL: 0.9 mg/dL (ref 0.2–1.2)
BUN: 10 mg/dL (ref 7–25)
CALCIUM: 9.4 mg/dL (ref 8.6–10.3)
CO2: 22 mmol/L (ref 20–32)
CREATININE: 0.88 mg/dL (ref 0.60–1.35)
Chloride: 101 mmol/L (ref 98–110)
GFR, Est Non African American: >89 mL/min (ref >=60)
Glucose, Bld: 88 mg/dL (ref 65–99)
Potassium: 4.9 mmol/L (ref 3.5–5.3)
Sodium: 136 mmol/L (ref 135–146)
Total Protein: 7.2 g/dL (ref 6.1–8.1)

## 2017-04-09 NOTE — Progress Notes (Signed)
wnl

## 2017-05-11 DIAGNOSIS — Z8719 Personal history of other diseases of the digestive system: Secondary | ICD-10-CM | POA: Insufficient documentation

## 2017-05-11 DIAGNOSIS — I10 Essential (primary) hypertension: Secondary | ICD-10-CM | POA: Insufficient documentation

## 2017-05-11 DIAGNOSIS — M19071 Primary osteoarthritis, right ankle and foot: Secondary | ICD-10-CM | POA: Insufficient documentation

## 2017-05-11 DIAGNOSIS — E785 Hyperlipidemia, unspecified: Secondary | ICD-10-CM | POA: Insufficient documentation

## 2017-05-11 DIAGNOSIS — M19041 Primary osteoarthritis, right hand: Secondary | ICD-10-CM | POA: Insufficient documentation

## 2017-05-11 DIAGNOSIS — Z8679 Personal history of other diseases of the circulatory system: Secondary | ICD-10-CM | POA: Insufficient documentation

## 2017-05-11 DIAGNOSIS — M19042 Primary osteoarthritis, left hand: Secondary | ICD-10-CM

## 2017-05-11 DIAGNOSIS — M170 Bilateral primary osteoarthritis of knee: Secondary | ICD-10-CM | POA: Insufficient documentation

## 2017-05-11 DIAGNOSIS — Z79899 Other long term (current) drug therapy: Secondary | ICD-10-CM | POA: Insufficient documentation

## 2017-05-11 DIAGNOSIS — M19072 Primary osteoarthritis, left ankle and foot: Secondary | ICD-10-CM

## 2017-05-11 NOTE — Progress Notes (Deleted)
   Office Visit Note  Patient: [name] [name]             Date of Birth: 1965/10/06           MRN: [id number]             PCP: System, Pcp Not In Referring: No ref. provider found Visit Date: 05/12/2017 Occupation: _0 @    Subjective:  No chief complaint on file.   History of Present Illness: [name] [name] is a 51 y.o. male ***   Activities of Daily Living:  Patient reports morning stiffness for *** {minute/hour:19697}.   Patient {ACTIONS;DENIES/REPORTS:21021675::"Denies"} nocturnal pain.  Difficulty dressing/grooming: {ACTIONS;DENIES/REPORTS:21021675::"Denies"} Difficulty climbing stairs: {ACTIONS;DENIES/REPORTS:21021675::"Denies"} Difficulty getting out of chair: {ACTIONS;DENIES/REPORTS:21021675::"Denies"} Difficulty using hands for taps, buttons, cutlery, and/or writing: {ACTIONS;DENIES/REPORTS:21021675::"Denies"}   No Rheumatology ROS completed.   PMFS History:  Patient Active Problem List   Diagnosis Date Noted  . Idiopathic chronic gout of left knee without tophus 04/06/2017  . Tobacco abuse 01/20/2013  . CAD (coronary artery disease) 01/11/2013    Past Medical History:  Diagnosis Date  . Anxiety   . COPD (chronic obstructive pulmonary disease) ([place])    ?COPD/asthma 01/2013.  Marland Kitchen Coronary artery disease    a. NSTEMI 01/2013 s/p PTCA to prox RCA occlusion.  Marland Kitchen Headache(784.0)   . HLD (hyperlipidemia)   . Hypertension   . Screening for chemical poisoning and contamination   . Tobacco abuse     Family History  Problem Relation Age of Onset  . CAD Brother 11   Past Surgical History:  Procedure Laterality Date  . KNEE SURGERY  2009   left knee orthroscopy  . LEFT HEART CATHETERIZATION WITH CORONARY ANGIOGRAM N/A 01/10/2013   Procedure: LEFT HEART CATHETERIZATION WITH CORONARY ANGIOGRAM;  Surgeon: Thayer Headings, MD;  Location[facility] CATH LAB;  Service: Cardiovascular;  Laterality: N/A;   Social History   Social History Narrative  . No narrative on file      Objective: Vital Signs: There were no vitals taken for this visit.   Physical Exam   Musculoskeletal Exam: ***  CDAI Exam: No CDAI exam completed.    Investigation: Findings:   01/28/2017 C-reactive protein  14.2 High ,CBC with Differential normal, Cell count + diff, w/ cryst-synvl fld Color, Synovial Positive for uric acid crystals,body Fluid Culture NO GROWTH ,Cyclic citrul peptide antibody, IgG normal, Rheumatoid factor normal ,Sed Rate (ESR) normal, and Uric acid 7.3  04/08/2017 CMP normal  Imaging: No results found.  Speciality Comments: No specialty comments available.    Procedures:  No procedures performed Allergies: Patient has no known allergies.   Assessment / Plan:     Visit Diagnoses: No diagnosis found.    Orders: No orders of the defined types were placed in this encounter.  No orders of the defined types were placed in this encounter.   Face-to-face time spent with patient was *** minutes. 50% of time was spent in counseling and coordination of care.  Follow-Up Instructions: No Follow-up on file.   Bo Merino, MD  Note - This record has been created using Editor, commissioning.  Chart creation errors have been sought, but may not always  have been located. Such creation errors do not reflect on  the standard of medical care.

## 2017-05-12 ENCOUNTER — Ambulatory Visit: Payer: 59 | Admitting: Rheumatology

## 2017-11-29 ENCOUNTER — Other Ambulatory Visit: Payer: Self-pay | Admitting: Rheumatology

## 2017-12-05 DIAGNOSIS — Z79899 Other long term (current) drug therapy: Secondary | ICD-10-CM | POA: Diagnosis not present

## 2017-12-05 DIAGNOSIS — N529 Male erectile dysfunction, unspecified: Secondary | ICD-10-CM | POA: Diagnosis not present

## 2017-12-05 DIAGNOSIS — M109 Gout, unspecified: Secondary | ICD-10-CM | POA: Diagnosis not present

## 2017-12-05 DIAGNOSIS — I10 Essential (primary) hypertension: Secondary | ICD-10-CM | POA: Diagnosis not present

## 2017-12-05 DIAGNOSIS — E7800 Pure hypercholesterolemia, unspecified: Secondary | ICD-10-CM | POA: Diagnosis not present

## 2018-04-03 DIAGNOSIS — F410 Panic disorder [episodic paroxysmal anxiety] without agoraphobia: Secondary | ICD-10-CM | POA: Diagnosis not present

## 2018-10-26 ENCOUNTER — Telehealth (INDEPENDENT_AMBULATORY_CARE_PROVIDER_SITE_OTHER): Payer: Self-pay | Admitting: Orthopedic Surgery

## 2018-10-26 NOTE — Telephone Encounter (Signed)
Patient's wife left a message wanting to know if Dr. Marlou Sa will send an RX for the Gout medication without the patient having to come into the office.  CB#[phone].  Thank you.

## 2018-10-26 NOTE — Telephone Encounter (Signed)
Please advise. Thanks.  

## 2018-10-27 NOTE — Telephone Encounter (Signed)
Patient advised that we would be unable to send in a prescription refill as he has not been seen since 2018 and we do not have updated labs. Patient has been scheduled for 10/28/18 at 11:15 am.

## 2018-10-27 NOTE — Progress Notes (Signed)
Office Visit Note  Patient: [name] [name]             Date of Birth: Dec 28, 1965           MRN: [id number]             PCP: System, Pcp Not In Referring: No ref. provider found Visit Date: 10/28/2018 Occupation: @GUAROCC @  Subjective:  Left knee joint pain   History of Present Illness: [name] [name] is a 53 y.o. male with history of gout and osteoarthritis.  He discontinued Allopurinol and colchicine about 2 years ago.  He denies any gout flares until Sunday when he started having symptoms of a flare in his left knee joint.  He reports intermittent left knee joint pain and swelling.  He has been taking tylenol for pain relief.  He has a right ring trigger finger.  The locking and tenderness has been interfering with his job.  He reports intermittent pain and swelling in both hands.  He denies any other joint pain or joint swelling.     Activities of Daily Living:  Patient reports morning stiffness for 2  minutes.   Patient Reports nocturnal pain.  Difficulty dressing/grooming: Denies Difficulty climbing stairs: Denies Difficulty getting out of chair: Denies Difficulty using hands for taps, buttons, cutlery, and/or writing: Denies  Review of Systems  Constitutional: Positive for fatigue. Negative for night sweats.  HENT: Negative for mouth sores, mouth dryness and nose dryness.   Eyes: Negative for redness, visual disturbance and dryness.  Respiratory: Negative for cough, hemoptysis, shortness of breath and difficulty breathing.   Cardiovascular: Positive for hypertension. Negative for chest pain, palpitations, irregular heartbeat and swelling in legs/feet.  Gastrointestinal: Negative for blood in stool, constipation and diarrhea.  Endocrine: Negative for increased urination.  Genitourinary: Negative for painful urination.  Musculoskeletal: Positive for arthralgias, joint pain, joint swelling and morning stiffness. Negative for myalgias, muscle weakness, muscle tenderness and myalgias.   Skin: Negative for color change, rash, hair loss, nodules/bumps, skin tightness, ulcers and sensitivity to sunlight.  Allergic/Immunologic: Negative for susceptible to infections.  Neurological: Positive for headaches. Negative for dizziness, fainting, memory loss, night sweats and weakness.  Hematological: Negative for swollen glands.  Psychiatric/Behavioral: Positive for sleep disturbance. Negative for depressed mood. The patient is not nervous/anxious.     PMFS History:  Patient Active Problem List   Diagnosis Date Noted  . High risk medication use 05/11/2017  . Primary osteoarthritis of both hands 05/11/2017  . Primary osteoarthritis of both knees 05/11/2017  . Primary osteoarthritis of both feet 05/11/2017  . History of coronary artery disease 05/11/2017  . History of gastroesophageal reflux (GERD) 05/11/2017  . Dyslipidemia 05/11/2017  . Essential hypertension 05/11/2017  . Idiopathic chronic gout of left knee without tophus 04/06/2017  . Tobacco abuse 01/20/2013  . CAD (coronary artery disease) 01/11/2013    Past Medical History:  Diagnosis Date  . Anxiety   . COPD (chronic obstructive pulmonary disease) ([place])    ?COPD/asthma 01/2013.  Marland Kitchen Coronary artery disease    a. NSTEMI 01/2013 s/p PTCA to prox RCA occlusion.  Marland Kitchen Headache(784.0)   . HLD (hyperlipidemia)   . Hypertension   . Screening for chemical poisoning and contamination   . Tobacco abuse     Family History  Problem Relation Age of Onset  . CAD Brother 44  . Stroke Father   . COPD Sister   . Drug abuse Brother   . Healthy Son   . Healthy Son   .  Healthy Daughter    Past Surgical History:  Procedure Laterality Date  . KNEE SURGERY  2009   left knee orthroscopy  . LEFT HEART CATHETERIZATION WITH CORONARY ANGIOGRAM N/A 01/10/2013   Procedure: LEFT HEART CATHETERIZATION WITH CORONARY ANGIOGRAM;  Surgeon: Thayer Headings, MD;  Location: [facility] CATH LAB;  Service: Cardiovascular;  Laterality: N/A;   Social History    Social History Narrative  . Not on file    There is no immunization history on file for this patient.   Objective: Vital Signs: BP (!) 190/105 (BP Location: Left Arm, Patient Position: Sitting, Cuff Size: Normal)   Pulse 75   Resp 15   Ht 6' (1.829 m)   Wt 206 lb (93.4 kg)   BMI 27.94 kg/m    Physical Exam Vitals signs and nursing note reviewed.  Constitutional:      Appearance: He is well-developed.  HENT:     Head: Normocephalic and atraumatic.  Eyes:     Conjunctiva/sclera: Conjunctivae normal.     Pupils: Pupils are equal, round, and reactive to light.  Neck:     Musculoskeletal: Normal range of motion and neck supple.  Cardiovascular:     Rate and Rhythm: Normal rate and regular rhythm.     Heart sounds: Normal heart sounds.  Pulmonary:     Effort: Pulmonary effort is normal.     Breath sounds: Normal breath sounds.  Abdominal:     General: Bowel sounds are normal.     Palpations: Abdomen is soft.  Lymphadenopathy:     Cervical: No cervical adenopathy.  Skin:    General: Skin is warm and dry.     Capillary Refill: Capillary refill takes less than 2 seconds.  Neurological:     Mental Status: He is alert and oriented to person, place, and time.  Psychiatric:        Behavior: Behavior normal.      Musculoskeletal Exam: C-spine, thoracic spine, and lumbar spine good ROM.  No midline spinal tenderness.  No SI joint tenderness.  No SI joint tenderness.  Shoulder joints, elbow joints, wrist joints, MCPs, PIPs ,and DIPs good ROM with no synovitis.  Complete fist formation bilaterally  Hip joints good ROM with no discomfort.  Left knee pain with ROM.  No warmth or effusion of knee joints noted.  No tenderness or swelling of ankle joints.    CDAI Exam: CDAI Score: Not documented Patient Global Assessment: Not documented; Provider Global Assessment: Not documented Swollen: Not documented; Tender: Not documented Joint Exam   Not documented   There is currently no  information documented on the homunculus. Go to the Rheumatology activity and complete the homunculus joint exam.  Investigation: No additional findings.  Imaging: No results found.  Recent Labs: Lab Results  Component Value Date   WBC 10.5 01/28/2017   HGB 17.0 01/28/2017   PLT 239 01/28/2017   NA 136 04/08/2017   K 4.9 04/08/2017   CL 101 04/08/2017   CO2 22 04/08/2017   GLUCOSE 88 04/08/2017   BUN 10 04/08/2017   CREATININE 0.88 04/08/2017   BILITOT 0.9 04/08/2017   ALKPHOS 54 04/08/2017   AST 24 04/08/2017   ALT 19 04/08/2017   PROT 7.2 04/08/2017   ALBUMIN 4.7 04/08/2017   CALCIUM 9.4 04/08/2017   GFRAA >89 04/08/2017    Speciality Comments: No specialty comments available.  Procedures:  No procedures performed Allergies: Patient has no known allergies.   Assessment / Plan:  Visit Diagnoses: Idiopathic chronic gout of left knee without tophus - Crystal proven, uric acid 7.11 January 2017: He was last evaluated on 04/08/2017 for management of gout.  At that time he was started on allopurinol and colchicine.  He reports that he took allopurinol and colchicine for about 2 months.  Prior to 4 days ago he had not had any gout flares over the past 2 years.  On Sunday he developed symptoms of a gout flare in the left knee joint. He denies eating any trigger foods/drinking beer.  He has been taking Tylenol for pain relief.  He has painful range of motion and stiffness in the left knee joint.  No warmth or effusion was noted on exam today.  We will obtain uric acid, CBC, and CMP prior to restarting on him on allopurinol and colchicine.  We will determine the dose and titration of allopurinol  Once his uric acid has returned. He will return in 1 month.   Primary osteoarthritis of both hands - X-ray showed only mild osteoarthritic changes: He has PIP and DIP synovial thickening consistent with osteoarthritis of bilateral hands.  He has no synovitis on exam.  He has complete fist  formation bilaterally.  Joint protection muscle strengthening were discussed.  Primary osteoarthritis of both knees - X-ray showed only mild osteoarthritic changes: He presents today with left knee joint pain.  No warmth or effusion of knee joints noted on exam.    Effusion of left knee - Synovial fluid positive for uric acid crystals: He presents today with left knee joint pain.  No warmth or effusion was noted on exam.   Primary osteoarthritis of both feet - X-ray shows mild osteoarthritic changes in feet.  He has no discomfort at this time.    Essential hypertension: His BP was very elevated today in the office-190/105.  He was advised to schedule an appointment with PCP ASAP or be evaluated at urgent care for evaluation and treatment.    Trigger finger, right ring finger: He has been experiencing tenderness and locking that has been interfering with his daily activities at work.  Due to elevation in BP we opted out of performing a cortisone injection.  Once his BP is better controlled he will be scheduled for an ultrasound guided cortisone injection.  In the meantime he can try buddy taping the adjacent finger and using Aspercreme topically.   Other medical conditions are listed as follows:   History of coronary artery disease  History of gastroesophageal reflux (GERD)  Dyslipidemia  Tobacco abuse   Orders: Orders Placed This Encounter  Procedures  . COMPLETE METABOLIC PANEL WITH GFR  . CBC with Differential/Platelet  . Uric acid  . CBC with Differential/Platelet  . COMPLETE METABOLIC PANEL WITH GFR   No orders of the defined types were placed in this encounter.     Follow-Up Instructions: Return in about 4 weeks (around 11/25/2018) for Gout, Osteoarthritis.   Ofilia Neas, PA-C   I examined and evaluated the patient with Hazel Sams PA.  Plan to start patient on colchicine and allopurinol after labs are available.  He was  been advised to go to the urgent care for  elevated blood pressure.  The plan of care was discussed as noted above.  Bo Merino, MD  Note - This record has been created using Editor, commissioning.  Chart creation errors have been sought, but may not always  have been located. Such creation errors do not reflect on  the standard  of medical care.

## 2018-10-27 NOTE — Telephone Encounter (Signed)
Deveschwar last to see in 2018

## 2018-10-27 NOTE — Telephone Encounter (Signed)
Please see below from Dr Marlou Sa.

## 2018-10-28 ENCOUNTER — Encounter: Payer: Self-pay | Admitting: Rheumatology

## 2018-10-28 ENCOUNTER — Other Ambulatory Visit: Payer: Self-pay

## 2018-10-28 ENCOUNTER — Ambulatory Visit (INDEPENDENT_AMBULATORY_CARE_PROVIDER_SITE_OTHER): Payer: 59 | Admitting: Rheumatology

## 2018-10-28 VITALS — BP 190/105 | HR 75 | Resp 15 | Ht 72.0 in | Wt 206.0 lb

## 2018-10-28 DIAGNOSIS — M25462 Effusion, left knee: Secondary | ICD-10-CM | POA: Diagnosis not present

## 2018-10-28 DIAGNOSIS — Z79899 Other long term (current) drug therapy: Secondary | ICD-10-CM

## 2018-10-28 DIAGNOSIS — M170 Bilateral primary osteoarthritis of knee: Secondary | ICD-10-CM

## 2018-10-28 DIAGNOSIS — Z8679 Personal history of other diseases of the circulatory system: Secondary | ICD-10-CM | POA: Diagnosis not present

## 2018-10-28 DIAGNOSIS — I10 Essential (primary) hypertension: Secondary | ICD-10-CM | POA: Diagnosis not present

## 2018-10-28 DIAGNOSIS — Z8719 Personal history of other diseases of the digestive system: Secondary | ICD-10-CM | POA: Diagnosis not present

## 2018-10-28 DIAGNOSIS — M19071 Primary osteoarthritis, right ankle and foot: Secondary | ICD-10-CM | POA: Diagnosis not present

## 2018-10-28 DIAGNOSIS — M19041 Primary osteoarthritis, right hand: Secondary | ICD-10-CM | POA: Diagnosis not present

## 2018-10-28 DIAGNOSIS — M19042 Primary osteoarthritis, left hand: Secondary | ICD-10-CM

## 2018-10-28 DIAGNOSIS — M1A0620 Idiopathic chronic gout, left knee, without tophus (tophi): Secondary | ICD-10-CM

## 2018-10-28 DIAGNOSIS — M65341 Trigger finger, right ring finger: Secondary | ICD-10-CM

## 2018-10-28 DIAGNOSIS — M19072 Primary osteoarthritis, left ankle and foot: Secondary | ICD-10-CM

## 2018-10-28 DIAGNOSIS — Z720 Tobacco use: Secondary | ICD-10-CM

## 2018-10-28 DIAGNOSIS — E785 Hyperlipidemia, unspecified: Secondary | ICD-10-CM

## 2018-10-29 ENCOUNTER — Telehealth: Payer: Self-pay | Admitting: *Deleted

## 2018-10-29 DIAGNOSIS — F17200 Nicotine dependence, unspecified, uncomplicated: Secondary | ICD-10-CM | POA: Diagnosis not present

## 2018-10-29 DIAGNOSIS — I10 Essential (primary) hypertension: Secondary | ICD-10-CM | POA: Diagnosis not present

## 2018-10-29 DIAGNOSIS — M1A0620 Idiopathic chronic gout, left knee, without tophus (tophi): Secondary | ICD-10-CM

## 2018-10-29 DIAGNOSIS — I214 Non-ST elevation (NSTEMI) myocardial infarction: Secondary | ICD-10-CM | POA: Diagnosis not present

## 2018-10-29 DIAGNOSIS — E782 Mixed hyperlipidemia: Secondary | ICD-10-CM | POA: Diagnosis not present

## 2018-10-29 DIAGNOSIS — Z9861 Coronary angioplasty status: Secondary | ICD-10-CM | POA: Diagnosis not present

## 2018-10-29 DIAGNOSIS — M129 Arthropathy, unspecified: Secondary | ICD-10-CM | POA: Diagnosis not present

## 2018-10-29 DIAGNOSIS — R739 Hyperglycemia, unspecified: Secondary | ICD-10-CM | POA: Diagnosis not present

## 2018-10-29 DIAGNOSIS — Z87891 Personal history of nicotine dependence: Secondary | ICD-10-CM | POA: Diagnosis not present

## 2018-10-29 LAB — COMPLETE METABOLIC PANEL WITH GFR
AG Ratio: 1.9 (calc) (ref 1.0–2.5)
ALBUMIN MSPROF: 4.6 g/dL (ref 3.6–5.1)
ALT: 21 U/L (ref 9–46)
AST: 24 U/L (ref 10–35)
Alkaline phosphatase (APISO): 45 U/L (ref 35–144)
BUN: 10 mg/dL (ref 7–25)
CALCIUM: 9.3 mg/dL (ref 8.6–10.3)
CO2: 26 mmol/L (ref 20–32)
CREATININE: 0.87 mg/dL (ref 0.70–1.33)
Chloride: 105 mmol/L (ref 98–110)
GFR, EST NON AFRICAN AMERICAN: 100 mL/min/{1.73_m2} (ref >=60)
GFR, Est African American: 116 mL/min/{1.73_m2} (ref >=60)
GLOBULIN: 2.4 g/dL (ref 1.9–3.7)
GLUCOSE: 91 mg/dL (ref 65–99)
Potassium: 4.1 mmol/L (ref 3.5–5.3)
SODIUM: 140 mmol/L (ref 135–146)
Total Bilirubin: 0.5 mg/dL (ref 0.2–1.2)
Total Protein: 7 g/dL (ref 6.1–8.1)

## 2018-10-29 LAB — CBC WITH DIFFERENTIAL/PLATELET
ABSOLUTE MONOCYTES: 386 {cells}/uL (ref 200–950)
BASOS PCT: 1.4 %
Basophils Absolute: 78 cells/uL (ref 0–200)
EOS ABS: 190 {cells}/uL (ref 15–500)
Eosinophils Relative: 3.4 %
HCT: 48.2 % (ref 38.5–50.0)
HEMOGLOBIN: 17.3 g/dL — AB (ref 13.2–17.1)
Lymphs Abs: 2453 cells/uL (ref 850–3900)
MCH: 35.7 pg — AB (ref 27.0–33.0)
MCHC: 35.9 g/dL (ref 32.0–36.0)
MCV: 99.6 fL (ref 80.0–100.0)
MPV: 10.6 fL (ref 7.5–12.5)
Monocytes Relative: 6.9 %
NEUTROS ABS: 2492 {cells}/uL (ref 1500–7800)
Neutrophils Relative %: 44.5 %
Platelets: 170 10*3/uL (ref 140–400)
RBC: 4.84 10*6/uL (ref 4.20–5.80)
RDW: 12.5 % (ref 11.0–15.0)
Total Lymphocyte: 43.8 %
WBC: 5.6 10*3/uL (ref 3.8–10.8)

## 2018-10-29 LAB — URIC ACID: URIC ACID, SERUM: 5.9 mg/dL (ref 4.0–8.0)

## 2018-10-29 MED ORDER — ALLOPURINOL 100 MG PO TABS: 100.0000 mg | ORAL_TABLET | Freq: Every day | ORAL | 0 refills | Status: DC

## 2018-10-29 NOTE — Progress Notes (Signed)
CMP WNL. CBC stable. Uric acid is 5.9.  please advise patient to restart on allopurinol 100 mg po daily.  We will recheck uric acid at 1 month follow up.

## 2018-10-29 NOTE — Telephone Encounter (Signed)
-----   Message from Ofilia Neas, PA-C sent at 10/29/2018  8:25 AM EDT ----- CMP WNL. CBC stable. Uric acid is 5.9.  please advise patient to restart on allopurinol 100 mg po daily.  We will recheck uric acid at 1 month follow up.

## 2018-11-17 DIAGNOSIS — E781 Pure hyperglyceridemia: Secondary | ICD-10-CM | POA: Diagnosis not present

## 2018-11-17 DIAGNOSIS — R768 Other specified abnormal immunological findings in serum: Secondary | ICD-10-CM | POA: Diagnosis not present

## 2018-11-17 DIAGNOSIS — F17210 Nicotine dependence, cigarettes, uncomplicated: Secondary | ICD-10-CM | POA: Diagnosis not present

## 2018-11-17 DIAGNOSIS — I10 Essential (primary) hypertension: Secondary | ICD-10-CM | POA: Diagnosis not present

## 2018-11-17 DIAGNOSIS — E782 Mixed hyperlipidemia: Secondary | ICD-10-CM | POA: Diagnosis not present

## 2018-11-18 NOTE — Progress Notes (Signed)
Virtual Visit via Telephone Note  I connected with [name] [name] on 11/24/18 at  2:30 PM EDT by telephone and verified that I am speaking with the correct person using two identifiers.   I discussed the limitations, risks, security and privacy concerns of performing an evaluation and management service by telephone and the availability of in person appointments. I also discussed with the patient that there may be a patient responsible charge related to this service. The patient expressed understanding and agreed to proceed.  CC: Left knee joint pain   History of Present Illness: Patient is a 53 year old male with a past medical history of gout and osteoarthritis.  He had a gout flare in the left knee joint that started 2 weeks ago.  He reports the pain and swelling have improved.  He has not taken prednisone or mitigare. He does not think he has been taking allopurinol.  He would like refills of both medications.  He continues to have a right ring trigger finger that has been locking up.   Review of Systems  Constitutional: Negative for fever and malaise/fatigue.  Eyes: Negative for photophobia, pain, discharge and redness.  Respiratory: Negative for cough, shortness of breath and wheezing.   Cardiovascular: Negative for chest pain and palpitations.  Gastrointestinal: Negative for blood in stool, constipation and diarrhea.  Genitourinary: Negative for dysuria.  Musculoskeletal: Positive for joint pain. Negative for back pain, myalgias and neck pain.  Skin: Negative for rash.  Neurological: Negative for dizziness and headaches.  Psychiatric/Behavioral: Negative for depression. The patient is not nervous/anxious and does not have insomnia.    Observations/Objective:  Physical Exam  Constitutional: He is oriented to person, place, and time.  Neurological: He is alert and oriented to person, place, and time.  Psychiatric: Mood, memory, affect and judgment normal.   Patient reports morning  stiffness for 30  minutes.   Patient reports nocturnal pain.  Difficulty dressing/grooming: Denies Difficulty climbing stairs: Reports Difficulty getting out of chair: Reports Difficulty using hands for taps, buttons, cutlery, and/or writing: Denies  Assessment and Plan: Visit Diagnoses: Idiopathic chronic gout of left knee without tophus: Crystal proven- He had a gout flare in his left knee joint 2 weeks ago. The left knee joint swelling has resolved. He is unsure if he has been taking allopurinol 100 mg 1 tablet po daily.  He has not been taking mitigare.  Uric acid was 5.9 on 10/28/18.  He was advised to increase allopurinol to 200 mg po daily and start taking Mitigare 0.6 mg 1 tablet by mouth daily.  Refills of both medications were sent to the pharmacy. We will send written instructions. He will follow up in 3 months.    Primary osteoarthritis of both hands - He has not had any joint pain or joint swelling at this time.   Primary osteoarthritis of both knees - He has left knee joint pain intermittently.     Effusion of left knee - Synovial fluid positive for uric acid crystals: He had a flare in the left knee joint 2 weeks ago.  Left knee joint pain and swelling has resolved.   Primary osteoarthritis of both feet -He has no feet pain at this time.     Essential hypertension:  According to the patient his BP has been better controlled since following up with his PCP at [facility].   Trigger finger, right ring finger: We will schedule an ultrasound guided cortisone injection in the future.  Follow Up Instructions: He will follow up in 3 months.  He will increase allopurinol to 200 mg po daily and start taking Mitigare 0.6 mg po daily.   Refills of allopurinol and mitigare were sent to the pharmacy.     I discussed the assessment and treatment plan with the patient. The patient was provided an opportunity to ask questions and all were answered. The patient agreed  with the plan and demonstrated an understanding of the instructions.   The patient was advised to call back or seek an in-person evaluation if the symptoms worsen or if the condition fails to improve as anticipated.  I provided 22 minutes of non-face-to-face time during this encounter.  Bo Merino, MD  Scribed by- Ofilia Neas, PA-C

## 2018-11-24 ENCOUNTER — Encounter: Payer: Self-pay | Admitting: Rheumatology

## 2018-11-24 ENCOUNTER — Telehealth (INDEPENDENT_AMBULATORY_CARE_PROVIDER_SITE_OTHER): Payer: 59 | Admitting: Rheumatology

## 2018-11-24 ENCOUNTER — Other Ambulatory Visit: Payer: Self-pay

## 2018-11-24 DIAGNOSIS — Z8679 Personal history of other diseases of the circulatory system: Secondary | ICD-10-CM

## 2018-11-24 DIAGNOSIS — Z8719 Personal history of other diseases of the digestive system: Secondary | ICD-10-CM

## 2018-11-24 DIAGNOSIS — M1A0620 Idiopathic chronic gout, left knee, without tophus (tophi): Secondary | ICD-10-CM

## 2018-11-24 DIAGNOSIS — M65341 Trigger finger, right ring finger: Secondary | ICD-10-CM

## 2018-11-24 DIAGNOSIS — M19072 Primary osteoarthritis, left ankle and foot: Secondary | ICD-10-CM

## 2018-11-24 DIAGNOSIS — M19041 Primary osteoarthritis, right hand: Secondary | ICD-10-CM

## 2018-11-24 DIAGNOSIS — M25462 Effusion, left knee: Secondary | ICD-10-CM

## 2018-11-24 DIAGNOSIS — M170 Bilateral primary osteoarthritis of knee: Secondary | ICD-10-CM

## 2018-11-24 DIAGNOSIS — I10 Essential (primary) hypertension: Secondary | ICD-10-CM

## 2018-11-24 DIAGNOSIS — Z720 Tobacco use: Secondary | ICD-10-CM

## 2018-11-24 DIAGNOSIS — M19042 Primary osteoarthritis, left hand: Secondary | ICD-10-CM

## 2018-11-24 DIAGNOSIS — E785 Hyperlipidemia, unspecified: Secondary | ICD-10-CM

## 2018-11-24 DIAGNOSIS — M19071 Primary osteoarthritis, right ankle and foot: Secondary | ICD-10-CM

## 2018-11-24 MED ORDER — MITIGARE 0.6 MG PO CAPS: 1.0000 | ORAL_CAPSULE | Freq: Every day | ORAL | 3 refills | Status: DC

## 2018-11-24 MED ORDER — ALLOPURINOL 100 MG PO TABS: ORAL_TABLET | ORAL | 0 refills | Status: DC

## 2018-11-24 NOTE — Patient Instructions (Signed)
Medication instructions:   Allopurinol 200mg  daily Colchicine 0.6mg  daily

## 2018-11-26 ENCOUNTER — Telehealth: Payer: Self-pay | Admitting: Rheumatology

## 2018-11-26 NOTE — Telephone Encounter (Signed)
-----   Message from [place] sent at 11/24/2018  1:36 PM EDT ----- Patient had virtual visit today with Dr. Estanislado Pandy. Please call to schedule 3 month follow up. Thanks!

## 2018-11-26 NOTE — Telephone Encounter (Signed)
I LMOM for patient to call, and schedule his next 3 month follow up appt with Dr. Estanislado Pandy.

## 2018-11-30 ENCOUNTER — Telehealth: Payer: Self-pay | Admitting: Rheumatology

## 2018-11-30 NOTE — Telephone Encounter (Signed)
Patient had virtual visit on 11/24/2018. Patient is supposed to take allopurinol 200mg  daily and colchicine 0.6mg  daily.

## 2018-11-30 NOTE — Telephone Encounter (Signed)
Patient's wife calling to discuss why patient is suppose to take two Gout medications now since he is not flaring. Please call to discuss.

## 2018-12-01 NOTE — Telephone Encounter (Signed)
Advised patient's wife of information below and she verbalized understanding. Patient's wife will call the office once patient is on 300mg  daily for a new prescription (for the 300mg  tablets). She will be coming by the office tomorrow to pick up a co-pay card for mitigare. (co-pay card is at the front desk with patient's name).

## 2018-12-01 NOTE — Telephone Encounter (Signed)
As patient had a recent flare, he was advised to stay both on colchicine and allopurinol for right now.  I advised him to increase allopurinol to 200 mg p.o. daily for 1 week.  Then we will increase his allopurinol dose to 300 mg daily.  He has to stay on colchicine daily for 6 months until his uric acid is well below 6 and he does not have any more flares.  Please call in prescription for allopurinol 300 mg p.o. daily and explained the increment of dosage to the patient and his wife.  Patient stated that his wife manages all his medications.

## 2018-12-02 ENCOUNTER — Telehealth: Payer: Self-pay | Admitting: Rheumatology

## 2018-12-02 NOTE — Telephone Encounter (Signed)
Spoke with the pharmacy and advised that patient should have a co-pay card for [place].

## 2018-12-02 NOTE — Telephone Encounter (Signed)
[name] [name] from [facility] called stating they received the prescription for Mitigare and are checking if they can substitute with generic.  Please call back at [phone].

## 2019-02-23 DIAGNOSIS — R739 Hyperglycemia, unspecified: Secondary | ICD-10-CM | POA: Diagnosis not present

## 2019-02-23 DIAGNOSIS — Z125 Encounter for screening for malignant neoplasm of prostate: Secondary | ICD-10-CM | POA: Diagnosis not present

## 2019-02-23 DIAGNOSIS — I214 Non-ST elevation (NSTEMI) myocardial infarction: Secondary | ICD-10-CM | POA: Diagnosis not present

## 2019-02-23 DIAGNOSIS — F17210 Nicotine dependence, cigarettes, uncomplicated: Secondary | ICD-10-CM | POA: Diagnosis not present

## 2019-02-23 DIAGNOSIS — Z79899 Other long term (current) drug therapy: Secondary | ICD-10-CM | POA: Diagnosis not present

## 2019-02-23 DIAGNOSIS — E782 Mixed hyperlipidemia: Secondary | ICD-10-CM | POA: Diagnosis not present

## 2019-03-24 DIAGNOSIS — R945 Abnormal results of liver function studies: Secondary | ICD-10-CM | POA: Diagnosis not present

## 2019-03-24 DIAGNOSIS — R739 Hyperglycemia, unspecified: Secondary | ICD-10-CM | POA: Diagnosis not present

## 2019-03-24 DIAGNOSIS — Z1159 Encounter for screening for other viral diseases: Secondary | ICD-10-CM | POA: Diagnosis not present

## 2019-03-24 DIAGNOSIS — E782 Mixed hyperlipidemia: Secondary | ICD-10-CM | POA: Diagnosis not present

## 2019-03-24 DIAGNOSIS — Z9861 Coronary angioplasty status: Secondary | ICD-10-CM | POA: Diagnosis not present

## 2019-03-24 DIAGNOSIS — I214 Non-ST elevation (NSTEMI) myocardial infarction: Secondary | ICD-10-CM | POA: Diagnosis not present

## 2019-03-24 DIAGNOSIS — F17210 Nicotine dependence, cigarettes, uncomplicated: Secondary | ICD-10-CM | POA: Diagnosis not present

## 2019-03-29 DIAGNOSIS — R945 Abnormal results of liver function studies: Secondary | ICD-10-CM | POA: Diagnosis not present

## 2019-04-19 DIAGNOSIS — I252 Old myocardial infarction: Secondary | ICD-10-CM | POA: Diagnosis not present

## 2019-04-19 DIAGNOSIS — R0602 Shortness of breath: Secondary | ICD-10-CM | POA: Diagnosis not present

## 2019-04-19 DIAGNOSIS — I2510 Atherosclerotic heart disease of native coronary artery without angina pectoris: Secondary | ICD-10-CM | POA: Diagnosis not present

## 2019-05-05 DIAGNOSIS — Z79899 Other long term (current) drug therapy: Secondary | ICD-10-CM | POA: Diagnosis not present

## 2019-05-05 DIAGNOSIS — I739 Peripheral vascular disease, unspecified: Secondary | ICD-10-CM | POA: Diagnosis not present

## 2019-05-05 DIAGNOSIS — I6523 Occlusion and stenosis of bilateral carotid arteries: Secondary | ICD-10-CM | POA: Diagnosis not present

## 2019-05-05 DIAGNOSIS — I2510 Atherosclerotic heart disease of native coronary artery without angina pectoris: Secondary | ICD-10-CM | POA: Diagnosis not present

## 2019-05-05 DIAGNOSIS — M79601 Pain in right arm: Secondary | ICD-10-CM | POA: Diagnosis not present

## 2019-05-05 DIAGNOSIS — I10 Essential (primary) hypertension: Secondary | ICD-10-CM | POA: Diagnosis not present

## 2019-05-05 DIAGNOSIS — E782 Mixed hyperlipidemia: Secondary | ICD-10-CM | POA: Diagnosis not present

## 2019-06-10 DIAGNOSIS — R945 Abnormal results of liver function studies: Secondary | ICD-10-CM | POA: Diagnosis not present

## 2019-06-11 DIAGNOSIS — K760 Fatty (change of) liver, not elsewhere classified: Secondary | ICD-10-CM | POA: Diagnosis not present

## 2019-06-11 DIAGNOSIS — Z800 Family history of malignant neoplasm of digestive organs: Secondary | ICD-10-CM | POA: Diagnosis not present

## 2019-06-11 DIAGNOSIS — Z1159 Encounter for screening for other viral diseases: Secondary | ICD-10-CM | POA: Diagnosis not present

## 2019-06-11 DIAGNOSIS — R945 Abnormal results of liver function studies: Secondary | ICD-10-CM | POA: Diagnosis not present

## 2019-06-11 DIAGNOSIS — Z23 Encounter for immunization: Secondary | ICD-10-CM | POA: Diagnosis not present

## 2019-06-30 DIAGNOSIS — K760 Fatty (change of) liver, not elsewhere classified: Secondary | ICD-10-CM | POA: Diagnosis not present

## 2019-07-22 DIAGNOSIS — Z20828 Contact with and (suspected) exposure to other viral communicable diseases: Secondary | ICD-10-CM | POA: Diagnosis not present

## 2019-07-25 DIAGNOSIS — Z1211 Encounter for screening for malignant neoplasm of colon: Secondary | ICD-10-CM | POA: Diagnosis not present

## 2019-07-25 DIAGNOSIS — Z01818 Encounter for other preprocedural examination: Secondary | ICD-10-CM | POA: Diagnosis not present

## 2019-07-25 DIAGNOSIS — K635 Polyp of colon: Secondary | ICD-10-CM | POA: Diagnosis not present

## 2019-07-28 DIAGNOSIS — K635 Polyp of colon: Secondary | ICD-10-CM | POA: Diagnosis not present

## 2019-07-29 ENCOUNTER — Emergency Department (HOSPITAL_COMMUNITY): Payer: Worker's Compensation

## 2019-07-29 ENCOUNTER — Emergency Department (HOSPITAL_COMMUNITY)
Admission: EM | Admit: 2019-07-29 | Discharge: 2019-07-29 | Disposition: A | Discharge status: Home / Self Care | Payer: Worker's Compensation | Attending: Emergency Medicine | Admitting: Emergency Medicine

## 2019-07-29 ENCOUNTER — Encounter (HOSPITAL_COMMUNITY): Payer: Self-pay | Admitting: Emergency Medicine

## 2019-07-29 ENCOUNTER — Other Ambulatory Visit: Payer: Self-pay

## 2019-07-29 DIAGNOSIS — S50812A Abrasion of left forearm, initial encounter: Secondary | ICD-10-CM | POA: Insufficient documentation

## 2019-07-29 DIAGNOSIS — S2241XA Multiple fractures of ribs, right side, initial encounter for closed fracture: Secondary | ICD-10-CM | POA: Insufficient documentation

## 2019-07-29 DIAGNOSIS — Z87891 Personal history of nicotine dependence: Secondary | ICD-10-CM | POA: Insufficient documentation

## 2019-07-29 DIAGNOSIS — S299XXA Unspecified injury of thorax, initial encounter: Secondary | ICD-10-CM | POA: Diagnosis not present

## 2019-07-29 DIAGNOSIS — N3289 Other specified disorders of bladder: Secondary | ICD-10-CM | POA: Diagnosis not present

## 2019-07-29 DIAGNOSIS — J449 Chronic obstructive pulmonary disease, unspecified: Secondary | ICD-10-CM | POA: Insufficient documentation

## 2019-07-29 DIAGNOSIS — I2510 Atherosclerotic heart disease of native coronary artery without angina pectoris: Secondary | ICD-10-CM | POA: Insufficient documentation

## 2019-07-29 DIAGNOSIS — Z7902 Long term (current) use of antithrombotics/antiplatelets: Secondary | ICD-10-CM | POA: Diagnosis not present

## 2019-07-29 DIAGNOSIS — Y92410 Unspecified street and highway as the place of occurrence of the external cause: Secondary | ICD-10-CM | POA: Insufficient documentation

## 2019-07-29 DIAGNOSIS — S0990XA Unspecified injury of head, initial encounter: Secondary | ICD-10-CM | POA: Diagnosis not present

## 2019-07-29 DIAGNOSIS — Y9389 Activity, other specified: Secondary | ICD-10-CM | POA: Diagnosis not present

## 2019-07-29 DIAGNOSIS — Y999 Unspecified external cause status: Secondary | ICD-10-CM | POA: Diagnosis not present

## 2019-07-29 DIAGNOSIS — K635 Polyp of colon: Secondary | ICD-10-CM | POA: Diagnosis not present

## 2019-07-29 DIAGNOSIS — M25511 Pain in right shoulder: Secondary | ICD-10-CM | POA: Diagnosis not present

## 2019-07-29 DIAGNOSIS — R0981 Nasal congestion: Secondary | ICD-10-CM | POA: Diagnosis not present

## 2019-07-29 DIAGNOSIS — Z7982 Long term (current) use of aspirin: Secondary | ICD-10-CM | POA: Diagnosis not present

## 2019-07-29 DIAGNOSIS — S3991XA Unspecified injury of abdomen, initial encounter: Secondary | ICD-10-CM | POA: Diagnosis not present

## 2019-07-29 DIAGNOSIS — Z20828 Contact with and (suspected) exposure to other viral communicable diseases: Secondary | ICD-10-CM | POA: Diagnosis not present

## 2019-07-29 DIAGNOSIS — S199XXA Unspecified injury of neck, initial encounter: Secondary | ICD-10-CM | POA: Diagnosis not present

## 2019-07-29 DIAGNOSIS — Z79899 Other long term (current) drug therapy: Secondary | ICD-10-CM | POA: Insufficient documentation

## 2019-07-29 DIAGNOSIS — I10 Essential (primary) hypertension: Secondary | ICD-10-CM | POA: Diagnosis not present

## 2019-07-29 LAB — CBC
HCT: 52.2 % — ABNORMAL HIGH (ref 39.0–52.0)
Hemoglobin: 17.6 g/dL — ABNORMAL HIGH (ref 13.0–17.0)
MCH: 34.9 pg — ABNORMAL HIGH (ref 26.0–34.0)
MCHC: 33.7 g/dL (ref 30.0–36.0)
MCV: 103.4 fL — ABNORMAL HIGH (ref 80.0–100.0)
Platelets: 174 10*3/uL (ref 150–400)
RBC: 5.05 MIL/uL (ref 4.22–5.81)
RDW: 12.4 % (ref 11.5–15.5)
WBC: 7.6 10*3/uL (ref 4.0–10.5)
nRBC: 0 % (ref 0.0–0.2)

## 2019-07-29 LAB — COMPREHENSIVE METABOLIC PANEL
ALT: 34 U/L (ref 0–44)
AST: 33 U/L (ref 15–41)
Albumin: 4 g/dL (ref 3.5–5.0)
Alkaline Phosphatase: 42 U/L (ref 38–126)
Anion gap: 9 (ref 5–15)
BUN: 11 mg/dL (ref 6–20)
CO2: 26 mmol/L (ref 22–32)
Calcium: 9.3 mg/dL (ref 8.9–10.3)
Chloride: 104 mmol/L (ref 98–111)
Creatinine, Ser: 1.14 mg/dL (ref 0.61–1.24)
GFR calc Af Amer: >60 mL/min (ref >60)
GFR calc non Af Amer: >60 mL/min (ref >60)
Glucose, Bld: 110 mg/dL — ABNORMAL HIGH (ref 70–99)
Potassium: 5.1 mmol/L (ref 3.5–5.1)
Sodium: 139 mmol/L (ref 135–145)
Total Bilirubin: 0.8 mg/dL (ref 0.3–1.2)
Total Protein: 6.9 g/dL (ref 6.5–8.1)

## 2019-07-29 MED ORDER — IOHEXOL 300 MG/ML  SOLN
100.0000 mL | Freq: Once | INTRAMUSCULAR | Status: AC | PRN
  Administered 2019-07-29: 100 mL via INTRAVENOUS

## 2019-07-29 MED ORDER — MORPHINE SULFATE (PF) 4 MG/ML IV SOLN
4.0000 mg | Freq: Once | INTRAVENOUS | Status: AC
  Administered 2019-07-29: 4 mg via INTRAVENOUS
  Filled 2019-07-29: qty 1

## 2019-07-29 MED ORDER — OXYCODONE-ACETAMINOPHEN 5-325 MG PO TABS: 1.0000 | ORAL_TABLET | ORAL | 0 refills | Status: DC | PRN

## 2019-07-29 MED ORDER — METHOCARBAMOL 500 MG PO TABS
500.0000 mg | ORAL_TABLET | Freq: Once | ORAL | Status: AC
  Administered 2019-07-29: 500 mg via ORAL
  Filled 2019-07-29: qty 1

## 2019-07-29 MED ORDER — LIDOCAINE 5 % EX PTCH
1.0000 | MEDICATED_PATCH | CUTANEOUS | Status: DC
  Administered 2019-07-29: 1 via TRANSDERMAL
  Filled 2019-07-29: qty 1

## 2019-07-29 MED ORDER — LIDOCAINE 5 % EX PTCH: 1.0000 | MEDICATED_PATCH | CUTANEOUS | 0 refills | Status: DC

## 2019-07-29 MED ORDER — METHOCARBAMOL 500 MG PO TABS: 500.0000 mg | ORAL_TABLET | Freq: Two times a day (BID) | ORAL | 0 refills | Status: DC | PRN

## 2019-07-29 MED ORDER — BACITRACIN ZINC 500 UNIT/GM EX OINT
TOPICAL_OINTMENT | Freq: Two times a day (BID) | CUTANEOUS | Status: DC
  Administered 2019-07-29: 1 via TOPICAL
  Filled 2019-07-29: qty 0.9

## 2019-07-29 NOTE — ED Notes (Signed)
Discharge instructions and prescriptions discussed with Pt. Pt verbalized understanding. Pt stable and leaving via [place] with wife.

## 2019-07-29 NOTE — ED Notes (Signed)
Bacitracin and dry dressing placed on wounds, pt educated about wound care.

## 2019-07-29 NOTE — Discharge Instructions (Signed)
Take Tylenol as needed for mild to moderate pain. Take peroccet as needed for severe or breakthrough pain. Have cautions, this is a narcotic. Do not drive or operate heavy machinery while taking this medication.  Use ice packs or heating pads if this helps control your pain. Use robaxin as needed for muscle stiffness or soreness. Have caution, this may also make you tired or groggy.  Use lidoderm patches for pain.  Use the incentive spirometer at least 10 times a day to prevent pneumonia. Wash the scrapes on your forearm with soap and water 2 times a day.  You will likely have continued/worsened muscle stiffness and soreness over the next several days.  Follow up with your primary care doctor next week for recheck. Return to the ER if you develop fevers, cough, difficulty breathing, severe worsening pain, vomiting, numbness, inability to walk, or any new, worsening, or concerning symptoms.

## 2019-07-29 NOTE — ED Notes (Signed)
Patient transported to CT 

## 2019-07-29 NOTE — ED Provider Notes (Signed)
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 Discharge Orders         Ordered    oxyCODONE-acetaminophen (PERCOCET/ROXICET) 5-325 MG tablet  Every 4 hours PRN     07/29/19 1352    methocarbamol (ROBAXIN) 500 MG tablet  2 times daily PRN     07/29/19 1352    lidocaine (LIDODERM) 5 %  Every 24 hours     07/29/19 1352           Tykeem Lanzer, PA-C 07/29/19 1702    Daleen Bo, MD 07/31/19 1328

## 2019-07-29 NOTE — ED Notes (Signed)
[name] [name] (wife)- [phone]
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
## 2019-07-30 LAB — NOVEL CORONAVIRUS, NAA (HOSP ORDER, SEND-OUT TO REF LAB; TAT 18-24 HRS): SARS-CoV-2, NAA: NOT DETECTED

## 2019-08-03 ENCOUNTER — Emergency Department (HOSPITAL_BASED_OUTPATIENT_CLINIC_OR_DEPARTMENT_OTHER)
Admission: EM | Admit: 2019-08-03 | Discharge: 2019-08-03 | Disposition: A | Discharge status: Home / Self Care | Payer: Worker's Compensation | Attending: Emergency Medicine | Admitting: Emergency Medicine

## 2019-08-03 ENCOUNTER — Emergency Department (HOSPITAL_BASED_OUTPATIENT_CLINIC_OR_DEPARTMENT_OTHER): Payer: Worker's Compensation

## 2019-08-03 ENCOUNTER — Other Ambulatory Visit: Payer: Self-pay

## 2019-08-03 ENCOUNTER — Encounter (HOSPITAL_BASED_OUTPATIENT_CLINIC_OR_DEPARTMENT_OTHER): Payer: Self-pay | Admitting: *Deleted

## 2019-08-03 DIAGNOSIS — Z87891 Personal history of nicotine dependence: Secondary | ICD-10-CM | POA: Diagnosis not present

## 2019-08-03 DIAGNOSIS — I10 Essential (primary) hypertension: Secondary | ICD-10-CM | POA: Diagnosis not present

## 2019-08-03 DIAGNOSIS — Y990 Civilian activity done for income or pay: Secondary | ICD-10-CM | POA: Diagnosis not present

## 2019-08-03 DIAGNOSIS — S2241XD Multiple fractures of ribs, right side, subsequent encounter for fracture with routine healing: Secondary | ICD-10-CM | POA: Insufficient documentation

## 2019-08-03 DIAGNOSIS — J181 Lobar pneumonia, unspecified organism: Secondary | ICD-10-CM | POA: Diagnosis not present

## 2019-08-03 DIAGNOSIS — I2510 Atherosclerotic heart disease of native coronary artery without angina pectoris: Secondary | ICD-10-CM | POA: Diagnosis not present

## 2019-08-03 DIAGNOSIS — Z20828 Contact with and (suspected) exposure to other viral communicable diseases: Secondary | ICD-10-CM | POA: Diagnosis not present

## 2019-08-03 DIAGNOSIS — S8251XA Displaced fracture of medial malleolus of right tibia, initial encounter for closed fracture: Secondary | ICD-10-CM | POA: Diagnosis not present

## 2019-08-03 DIAGNOSIS — Y92410 Unspecified street and highway as the place of occurrence of the external cause: Secondary | ICD-10-CM | POA: Insufficient documentation

## 2019-08-03 DIAGNOSIS — Y9389 Activity, other specified: Secondary | ICD-10-CM | POA: Diagnosis not present

## 2019-08-03 DIAGNOSIS — J449 Chronic obstructive pulmonary disease, unspecified: Secondary | ICD-10-CM | POA: Diagnosis not present

## 2019-08-03 DIAGNOSIS — R0789 Other chest pain: Secondary | ICD-10-CM | POA: Diagnosis present

## 2019-08-03 DIAGNOSIS — Z79899 Other long term (current) drug therapy: Secondary | ICD-10-CM | POA: Insufficient documentation

## 2019-08-03 DIAGNOSIS — J189 Pneumonia, unspecified organism: Secondary | ICD-10-CM

## 2019-08-03 LAB — COMPREHENSIVE METABOLIC PANEL WITH GFR
ALT: 25 U/L (ref 0–44)
AST: 27 U/L (ref 15–41)
Albumin: 3.9 g/dL (ref 3.5–5.0)
Alkaline Phosphatase: 60 U/L (ref 38–126)
Anion gap: 10 (ref 5–15)
BUN: 8 mg/dL (ref 6–20)
CO2: 29 mmol/L (ref 22–32)
Calcium: 9 mg/dL (ref 8.9–10.3)
Chloride: 97 mmol/L — ABNORMAL LOW (ref 98–111)
Creatinine, Ser: 0.79 mg/dL (ref 0.61–1.24)
GFR calc Af Amer: >60 mL/min
GFR calc non Af Amer: >60 mL/min
Glucose, Bld: 111 mg/dL — ABNORMAL HIGH (ref 70–99)
Potassium: 4 mmol/L (ref 3.5–5.1)
Sodium: 136 mmol/L (ref 135–145)
Total Bilirubin: 1 mg/dL (ref 0.3–1.2)
Total Protein: 7.4 g/dL (ref 6.5–8.1)

## 2019-08-03 LAB — CBC WITH DIFFERENTIAL/PLATELET
Abs Immature Granulocytes: 0.03 10*3/uL (ref 0.00–0.07)
Basophils Absolute: 0.1 10*3/uL (ref 0.0–0.1)
Basophils Relative: 1 %
Eosinophils Absolute: 0.3 10*3/uL (ref 0.0–0.5)
Eosinophils Relative: 3 %
HCT: 44.2 % (ref 39.0–52.0)
Hemoglobin: 14.9 g/dL (ref 13.0–17.0)
Immature Granulocytes: 0 %
Lymphocytes Relative: 21 %
Lymphs Abs: 1.8 10*3/uL (ref 0.7–4.0)
MCH: 34.7 pg — ABNORMAL HIGH (ref 26.0–34.0)
MCHC: 33.7 g/dL (ref 30.0–36.0)
MCV: 102.8 fL — ABNORMAL HIGH (ref 80.0–100.0)
Monocytes Absolute: 0.9 10*3/uL (ref 0.1–1.0)
Monocytes Relative: 10 %
Neutro Abs: 5.6 10*3/uL (ref 1.7–7.7)
Neutrophils Relative %: 65 %
Platelets: 177 10*3/uL (ref 150–400)
RBC: 4.3 MIL/uL (ref 4.22–5.81)
RDW: 11.9 % (ref 11.5–15.5)
WBC: 8.7 10*3/uL (ref 4.0–10.5)
nRBC: 0 % (ref 0.0–0.2)

## 2019-08-03 LAB — SARS CORONAVIRUS 2 (TAT 6-24 HRS): SARS Coronavirus 2: NEGATIVE

## 2019-08-03 MED ORDER — AMOXICILLIN-POT CLAVULANATE 875-125 MG PO TABS: 1.0000 | ORAL_TABLET | Freq: Two times a day (BID) | ORAL | 0 refills | Status: DC

## 2019-08-03 MED ORDER — ALBUTEROL SULFATE HFA 108 (90 BASE) MCG/ACT IN AERS
4.0000 | INHALATION_SPRAY | Freq: Once | RESPIRATORY_TRACT | Status: AC
  Administered 2019-08-03: 14:00:00 4 via RESPIRATORY_TRACT
  Filled 2019-08-03: qty 6.7

## 2019-08-03 MED ORDER — AMOXICILLIN-POT CLAVULANATE 875-125 MG PO TABS
1.0000 | ORAL_TABLET | Freq: Once | ORAL | Status: AC
  Administered 2019-08-03: 1 via ORAL
  Filled 2019-08-03: qty 1

## 2019-08-03 MED ORDER — AMOXICILLIN-POT CLAVULANATE 875-125 MG PO TABS: 1.0000 | ORAL_TABLET | Freq: Once | ORAL | Status: DC

## 2019-08-03 MED ORDER — MORPHINE SULFATE (PF) 4 MG/ML IV SOLN
4.0000 mg | Freq: Once | INTRAVENOUS | Status: AC
  Administered 2019-08-03: 4 mg via INTRAVENOUS
  Filled 2019-08-03: qty 1

## 2019-08-03 MED ORDER — OXYCODONE-ACETAMINOPHEN 5-325 MG PO TABS: 1.0000 | ORAL_TABLET | Freq: Four times a day (QID) | ORAL | 0 refills | Status: AC | PRN

## 2019-08-03 NOTE — ED Notes (Signed)
ED Provider at bedside. 
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
## 2019-08-03 NOTE — ED Notes (Signed)
Patient transported to X-ray 
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
## 2019-08-22 DIAGNOSIS — R0781 Pleurodynia: Secondary | ICD-10-CM | POA: Insufficient documentation

## 2019-08-22 DIAGNOSIS — S99911A Unspecified injury of right ankle, initial encounter: Secondary | ICD-10-CM | POA: Insufficient documentation

## 2019-08-29 ENCOUNTER — Ambulatory Visit: Payer: 59 | Admitting: Orthopedic Surgery

## 2019-08-30 DIAGNOSIS — S8253XA Displaced fracture of medial malleolus of unspecified tibia, initial encounter for closed fracture: Secondary | ICD-10-CM | POA: Insufficient documentation

## 2019-08-30 DIAGNOSIS — S2241XA Multiple fractures of ribs, right side, initial encounter for closed fracture: Secondary | ICD-10-CM | POA: Insufficient documentation

## 2019-09-14 DIAGNOSIS — M25579 Pain in unspecified ankle and joints of unspecified foot: Secondary | ICD-10-CM | POA: Insufficient documentation

## 2020-02-14 DIAGNOSIS — G5620 Lesion of ulnar nerve, unspecified upper limb: Secondary | ICD-10-CM | POA: Insufficient documentation
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
## 2020-04-25 ENCOUNTER — Institutional Professional Consult (permissible substitution): Payer: 59 | Admitting: Orthopedic Surgery

## 2020-05-16 ENCOUNTER — Ambulatory Visit (INDEPENDENT_AMBULATORY_CARE_PROVIDER_SITE_OTHER): Payer: Worker's Compensation | Admitting: Orthopedic Surgery

## 2020-05-16 ENCOUNTER — Encounter: Payer: Self-pay | Admitting: Orthopedic Surgery

## 2020-05-16 DIAGNOSIS — S2241XS Multiple fractures of ribs, right side, sequela: Secondary | ICD-10-CM | POA: Diagnosis not present

## 2020-05-16 DIAGNOSIS — M7551 Bursitis of right shoulder: Secondary | ICD-10-CM | POA: Diagnosis not present
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
## 2020-05-24 ENCOUNTER — Other Ambulatory Visit: Payer: Self-pay

## 2020-05-24 DIAGNOSIS — S2241XS Multiple fractures of ribs, right side, sequela: Secondary | ICD-10-CM

## 2020-06-01 NOTE — Progress Notes (Signed)
[name] [name] I am not really sure what this message means because there is no report available

## 2020-06-08 ENCOUNTER — Telehealth: Payer: Self-pay | Admitting: Orthopedic Surgery

## 2020-06-08 NOTE — Telephone Encounter (Signed)
Called left 1X vm for pt to call and set appt for CT Scan review. Please will try another time.

## 2020-06-20 ENCOUNTER — Other Ambulatory Visit: Payer: Self-pay

## 2020-06-20 ENCOUNTER — Ambulatory Visit (INDEPENDENT_AMBULATORY_CARE_PROVIDER_SITE_OTHER): Payer: Worker's Compensation | Admitting: Orthopedic Surgery

## 2020-06-20 ENCOUNTER — Other Ambulatory Visit (HOSPITAL_COMMUNITY): Payer: Self-pay | Admitting: Orthopedic Surgery

## 2020-06-20 VITALS — Ht 72.0 in

## 2020-06-20 DIAGNOSIS — S2241XS Multiple fractures of ribs, right side, sequela: Secondary | ICD-10-CM | POA: Diagnosis not present

## 2020-06-21 ENCOUNTER — Other Ambulatory Visit: Payer: Self-pay | Admitting: Orthopedic Surgery

## 2020-06-21 LAB — EXTRA LAV TOP TUBE

## 2020-06-21 LAB — THYROID PANEL WITH TSH
Free Thyroxine Index: 2 (ref 1.4–3.8)
T3 Uptake: 32 % (ref 22–35)
T4, Total: 6.4 ug/dL (ref 4.9–10.5)
TSH: 2.41 mIU/L (ref 0.40–4.50)

## 2020-06-21 LAB — VITAMIN D 25 HYDROXY (VIT D DEFICIENCY, FRACTURES): Vit D, 25-Hydroxy: 22 ng/mL — ABNORMAL LOW (ref 30–100)

## 2020-06-21 MED ORDER — VITAMIN D (ERGOCALCIFEROL) 1.25 MG (50000 UNIT) PO CAPS: 50000.0000 [IU] | ORAL_CAPSULE | ORAL | 0 refills | Status: DC

## 2020-06-21 NOTE — Progress Notes (Signed)
Pls call in vit d3 50000 units po q week for 8 weeks thx

## 2020-06-23 ENCOUNTER — Encounter: Payer: Self-pay | Admitting: Orthopedic Surgery

## 2020-06-23 NOTE — Progress Notes (Signed)
Office Visit Note   Patient: [name] [name]           Date of Birth: 1966-01-14           MRN: [id number] Visit Date: 06/20/2020 Requested by: Gwendel Hanson [facility]  [place],  [place] [address] PCP: Gwendel Hanson  Subjective: Chief Complaint  Patient presents with  . Chest - Follow-up    CT Chest review OTJI 07/29/2019    HPI: [name] [name] is a 54 year old patient with right shoulder and chest discomfort.  Continues to feel popping.  Since have seen him he has had a CT scan of the chest which does confirm 1 healed posterior rib fracture and 5 unhealed posterior rib fractures.  He is a smoker.  Robie Ridge is present for discussion of treatment plan.  He also has a history of right elbow ulnar nerve decompression from which she remains relatively symptomatic..              ROS: All systems reviewed are negative as they relate to the chief complaint within the history of present illness.  Patient denies  fevers or chills.   Assessment & Plan: Visit Diagnoses:  1. 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  Vitamin D level obtained today and at the time of this dictation it is low at 22.  We will supplement him for the next 8 weeks with oral vitamin D.  Thyroid panel normal.  Out of work for least 6 more weeks and then Dr. Roxan Hockey can address his work status after rib plating.  Encouraged him to stop smoking.  Regarding whether or not he needs therapy on the elbow I think he has good range of motion at this time and it just may take a little bit more time for this to heal.  Follow-Up Instructions: No follow-ups on file.   Orders:  Orders Placed This Encounter  Procedures  . Vitamin D (25 hydroxy)  . Thyroid Panel With TSH  . EXTRA LAV TOP TUBE  . Ambulatory referral to Cardiothoracic Surgery   Meds ordered this encounter  Medications  .  Vitamin D, Ergocalciferol, (DRISDOL) 1.25 MG (50000 UNIT) CAPS capsule    Sig: Take 1 capsule (50,000 Units total) by mouth every 7 (seven) days.    Dispense:  8 capsule    Refill:  0      Procedures: No procedures performed   Clinical Data: No additional findings.  Objective: Vital Signs: Ht 6' (1.829 m)   BMI 27.94 kg/m   Physical Exam:   Constitutional: Patient appears well-developed HEENT:  Head: Normocephalic Eyes:EOM are normal Neck: Normal range of motion Cardiovascular: Normal rate Pulmonary/chest: Effort normal Neurologic: Patient is alert Skin: Skin is warm Psychiatric: Patient has normal mood and affect    Ortho Exam: Ortho exam demonstrates normal cervical spine range of motion.  Shoulder exam is unchanged with no scapular dyskinesia and good rotator cuff strength but definitely pain with scapular protraction and retraction.  Right elbow has full range of motion but still has interosseous weakness.  Grip strength also slightly diminished right versus left.  Specialty Comments:  No specialty comments available.  Imaging: No results found.   PMFS History: Patient Active Problem List   Diagnosis Date Noted  . High risk medication use 05/11/2017  . Primary osteoarthritis of both hands 05/11/2017  .  Primary osteoarthritis of both knees 05/11/2017  . Primary osteoarthritis of both feet 05/11/2017  . History of coronary artery disease 05/11/2017  . History of gastroesophageal reflux (GERD) 05/11/2017  . Dyslipidemia 05/11/2017  . Essential hypertension 05/11/2017  . Idiopathic chronic gout of left knee without tophus 04/06/2017  . Tobacco abuse 01/20/2013  . CAD (coronary artery disease) 01/11/2013   Past Medical History:  Diagnosis Date  . Anxiety   . COPD (chronic obstructive pulmonary disease) ([place])    ?COPD/asthma 01/2013.  Marland Kitchen Coronary artery disease    a. NSTEMI 01/2013 s/p PTCA to prox RCA occlusion.  Marland Kitchen Headache(784.0)   . HLD (hyperlipidemia)    . Hypertension   . Screening for chemical poisoning and contamination   . Tobacco abuse     Family History  Problem Relation Age of Onset  . CAD Brother 3  . Stroke Father   . COPD Sister   . Drug abuse Brother   . Healthy Son   . Healthy Son   . Healthy Daughter     Past Surgical History:  Procedure Laterality Date  . KNEE SURGERY  2009   left knee orthroscopy  . LEFT HEART CATHETERIZATION WITH CORONARY ANGIOGRAM N/A 01/10/2013   Procedure: LEFT HEART CATHETERIZATION WITH CORONARY ANGIOGRAM;  Surgeon: Thayer Headings, MD;  Location: [facility] CATH LAB;  Service: Cardiovascular;  Laterality: N/A;   Social History   Occupational History  . Not on file  Tobacco Use  . Smoking status: Former Smoker    Packs/day: 1.50    Years: 15.00    Pack years: 22.50    Types: Cigarettes    Quit date: 01/09/2013    Years since quitting: 7.4  . Smokeless tobacco: Never Used  Vaping Use  . Vaping Use: Never used  Substance and Sexual Activity  . Alcohol use: Yes    Comment: occ  . Drug use: No  . Sexual activity: Yes

## 2020-07-17 ENCOUNTER — Other Ambulatory Visit (HOSPITAL_COMMUNITY): Payer: Self-pay | Admitting: Orthopedic Surgery

## 2020-07-18 ENCOUNTER — Telehealth: Payer: Self-pay | Admitting: *Deleted

## 2020-07-18 NOTE — Telephone Encounter (Signed)
Spoke with pt to remind him to bring CT of chest disk to appt with Dr. Roxan Hockey tomorrow. Pt verbalized understanding.

## 2020-07-19 ENCOUNTER — Encounter: Payer: Worker's Compensation | Admitting: Thoracic Surgery (Cardiothoracic Vascular Surgery)

## 2020-08-14 ENCOUNTER — Other Ambulatory Visit: Payer: Self-pay

## 2020-08-14 ENCOUNTER — Other Ambulatory Visit: Payer: Self-pay | Admitting: *Deleted

## 2020-08-14 ENCOUNTER — Institutional Professional Consult (permissible substitution) (INDEPENDENT_AMBULATORY_CARE_PROVIDER_SITE_OTHER): Payer: Worker's Compensation | Admitting: Thoracic Surgery (Cardiothoracic Vascular Surgery)

## 2020-08-14 ENCOUNTER — Encounter: Payer: Self-pay | Admitting: Thoracic Surgery (Cardiothoracic Vascular Surgery)

## 2020-08-14 ENCOUNTER — Encounter: Payer: Self-pay | Admitting: *Deleted

## 2020-08-14 VITALS — BP 163/78 | HR 86 | Temp 97.9°F | Resp 20 | Ht 72.0 in | Wt 204.0 lb

## 2020-08-14 DIAGNOSIS — Z026 Encounter for examination for insurance purposes: Secondary | ICD-10-CM | POA: Diagnosis not present

## 2020-08-14 DIAGNOSIS — S2249XA Multiple fractures of ribs, unspecified side, initial encounter for closed fracture: Secondary | ICD-10-CM | POA: Diagnosis not present

## 2020-08-14 DIAGNOSIS — S2231XG Fracture of one rib, right side, subsequent encounter for fracture with delayed healing: Secondary | ICD-10-CM
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
## 2020-08-21 ENCOUNTER — Encounter: Payer: Self-pay | Admitting: *Deleted

## 2020-08-27 ENCOUNTER — Other Ambulatory Visit (HOSPITAL_COMMUNITY): Payer: Self-pay | Admitting: Orthopedic Surgery

## 2020-08-28 ENCOUNTER — Other Ambulatory Visit (HOSPITAL_COMMUNITY): Payer: 59

## 2020-09-05 ENCOUNTER — Other Ambulatory Visit (HOSPITAL_COMMUNITY): Payer: Self-pay | Admitting: Specialist

## 2020-09-12 NOTE — Progress Notes (Signed)
Surgical Instructions    Your procedure is scheduled on Monday, September 17, 2020 from 7:30AM - 10:56AM .  Report to [facility] Main Entrance "A" at 5:30 A.M., then check in with the Admitting office.  Call this number if you have problems the morning of surgery:  [phone]   If you have any questions prior to your surgery date call [phone]: Open Monday-Friday 8am-4pm    Remember:  Do not eat or drink after midnight the night before your surgery    Take these medicines the morning of surgery with A SIP OF WATER:  allopurinol (ZYLOPRIM)  pregabalin (LYRICA) nitroGLYCERIN (NITROSTAT) - if needed  As of today, STOP taking any Aspirin (unless otherwise instructed by your surgeon) Aleve, Naproxen, Ibuprofen, Motrin, Advil, Goody's, BC's, all herbal medications, fish oil, and all vitamins.                     Do not wear jewelry.            Do not wear lotions, powders, colognes, or deodorant.            Do not shave 48 hours prior to surgery.  Men may shave face and neck.            Do not bring valuables to the hospital.            [facility] is not responsible for any belongings or valuables.  Do NOT Smoke (Tobacco/Vaping) or drink Alcohol 24 hours prior to your procedure If you use a CPAP at night, you may bring all equipment for your overnight stay.   Contacts, glasses, dentures or bridgework may not be worn into surgery, please bring cases for these belongings   For patients admitted to the hospital, discharge time will be determined by your treatment team.   Patients discharged the day of surgery will not be allowed to drive home, and someone needs to stay with them for 24 hours.    Special instructions:   [place]- Preparing For Surgery  Before surgery, you can play an important role. Because skin is not sterile, your skin needs to be as free of germs as possible. You can reduce the number of germs on your skin by washing with CHG (chlorahexidine gluconate) Soap  before surgery.  CHG is an antiseptic cleaner which kills germs and bonds with the skin to continue killing germs even after washing.    Oral Hygiene is also important to reduce your risk of infection.  Remember - BRUSH YOUR TEETH THE MORNING OF SURGERY WITH YOUR REGULAR TOOTHPASTE  Please do not use if you have an allergy to CHG or antibacterial soaps. If your skin becomes reddened/irritated stop using the CHG.  Do not shave (including legs and underarms) for at least 48 hours prior to first CHG shower. It is OK to shave your face.  Please follow these instructions carefully.   1. If you chose to wash your hair, wash your hair first as usual with your normal shampoo.  2. After you shampoo, rinse your hair and body thoroughly to remove the shampoo.  3. Wash Face and genitals (private parts) with your normal soap.   4. THEN Shower the NIGHT BEFORE SURGERY and the MORNING OF SURGERY with CHG Soap.   5. Use CHG as you would any other liquid soap. You can apply CHG directly to the skin and wash gently with a scrungie or a clean washcloth.   6. Apply the CHG  Soap to your body ONLY FROM THE NECK DOWN.  Do not use on open wounds or open sores. Avoid contact with your eyes, ears, mouth and genitals (private parts). Wash Face and genitals (private parts)  with your normal soap.   7. Wash thoroughly, paying special attention to the area where your surgery will be performed.  8. Thoroughly rinse your body with warm water from the neck down.  9. DO NOT shower/wash with your normal soap after using and rinsing off the CHG Soap.  10. Pat yourself dry with a CLEAN TOWEL.  11. Wear CLEAN PAJAMAS to bed the night before surgery  12. Place CLEAN SHEETS on your bed the night before your surgery  13. DO NOT SLEEP WITH PETS.   Day of Surgery: Wear Clean/Comfortable clothing the morning of surgery Do not apply any deodorants/lotions.   Remember to brush your teeth WITH YOUR REGULAR TOOTHPASTE.    Please read over the following fact sheets that you were given.

## 2020-09-13 ENCOUNTER — Encounter (HOSPITAL_COMMUNITY)
Admission: RE | Admit: 2020-09-13 | Discharge: 2020-09-13 | Disposition: A | Discharge status: Home / Self Care | Payer: Worker's Compensation | Source: Ambulatory Visit | Attending: Thoracic Surgery (Cardiothoracic Vascular Surgery) | Admitting: Thoracic Surgery (Cardiothoracic Vascular Surgery)

## 2020-09-13 ENCOUNTER — Encounter (HOSPITAL_COMMUNITY): Payer: Self-pay

## 2020-09-13 ENCOUNTER — Other Ambulatory Visit: Payer: Self-pay

## 2020-09-13 ENCOUNTER — Other Ambulatory Visit (HOSPITAL_COMMUNITY)
Admission: RE | Admit: 2020-09-13 | Discharge: 2020-09-13 | Disposition: A | Discharge status: Home / Self Care | Payer: Worker's Compensation | Source: Ambulatory Visit | Attending: Thoracic Surgery (Cardiothoracic Vascular Surgery) | Admitting: Thoracic Surgery (Cardiothoracic Vascular Surgery)

## 2020-09-13 DIAGNOSIS — I10 Essential (primary) hypertension: Secondary | ICD-10-CM | POA: Insufficient documentation

## 2020-09-13 DIAGNOSIS — Z20822 Contact with and (suspected) exposure to covid-19: Secondary | ICD-10-CM | POA: Insufficient documentation

## 2020-09-13 DIAGNOSIS — I2510 Atherosclerotic heart disease of native coronary artery without angina pectoris: Secondary | ICD-10-CM | POA: Insufficient documentation

## 2020-09-13 DIAGNOSIS — S2231XG Fracture of one rib, right side, subsequent encounter for fracture with delayed healing: Secondary | ICD-10-CM

## 2020-09-13 DIAGNOSIS — J449 Chronic obstructive pulmonary disease, unspecified: Secondary | ICD-10-CM | POA: Diagnosis not present

## 2020-09-13 DIAGNOSIS — I252 Old myocardial infarction: Secondary | ICD-10-CM | POA: Insufficient documentation

## 2020-09-13 DIAGNOSIS — Z01812 Encounter for preprocedural laboratory examination: Secondary | ICD-10-CM | POA: Insufficient documentation

## 2020-09-13 DIAGNOSIS — Z01818 Encounter for other preprocedural examination: Secondary | ICD-10-CM | POA: Diagnosis not present

## 2020-09-13 DIAGNOSIS — Z87891 Personal history of nicotine dependence: Secondary | ICD-10-CM | POA: Diagnosis not present

## 2020-09-13 HISTORY — DX: Gout, unspecified: M10.9

## 2020-09-13 LAB — TYPE AND SCREEN
ABO/RH(D): A POS
Antibody Screen: NEGATIVE

## 2020-09-13 LAB — CBC
HCT: 54.7 % — ABNORMAL HIGH (ref 39.0–52.0)
Hemoglobin: 18.9 g/dL — ABNORMAL HIGH (ref 13.0–17.0)
MCH: 35.4 pg — ABNORMAL HIGH (ref 26.0–34.0)
MCHC: 34.6 g/dL (ref 30.0–36.0)
MCV: 102.4 fL — ABNORMAL HIGH (ref 80.0–100.0)
Platelets: 130 10*3/uL — ABNORMAL LOW (ref 150–400)
RBC: 5.34 MIL/uL (ref 4.22–5.81)
RDW: 12.8 % (ref 11.5–15.5)
WBC: 7 10*3/uL (ref 4.0–10.5)
nRBC: 0 % (ref 0.0–0.2)

## 2020-09-13 LAB — BLOOD GAS, ARTERIAL
Acid-base deficit: 0.1 mmol/L (ref 0.0–2.0)
Bicarbonate: 24.2 mmol/L (ref 20.0–28.0)
FIO2: 21
O2 Saturation: 97.3 %
Patient temperature: 37
pCO2 arterial: 40.5 mmHg (ref 32.0–48.0)
pH, Arterial: 7.394 (ref 7.350–7.450)
pO2, Arterial: 85.1 mmHg (ref 83.0–108.0)

## 2020-09-13 LAB — SURGICAL PCR SCREEN
MRSA, PCR: NEGATIVE
Staphylococcus aureus: NEGATIVE

## 2020-09-13 LAB — URINALYSIS, ROUTINE W REFLEX MICROSCOPIC
Bilirubin Urine: NEGATIVE
Glucose, UA: NEGATIVE mg/dL
Hgb urine dipstick: NEGATIVE
Ketones, ur: NEGATIVE mg/dL
Leukocytes,Ua: NEGATIVE
Nitrite: NEGATIVE
Protein, ur: NEGATIVE mg/dL
Specific Gravity, Urine: 1.008 (ref 1.005–1.030)
pH: 5 (ref 5.0–8.0)

## 2020-09-13 LAB — PROTIME-INR
INR: 0.9 (ref 0.8–1.2)
Prothrombin Time: 11.7 seconds (ref 11.4–15.2)

## 2020-09-13 LAB — APTT: aPTT: 30 seconds (ref 24–36)

## 2020-09-13 LAB — SARS CORONAVIRUS 2 (TAT 6-24 HRS): SARS Coronavirus 2: NEGATIVE

## 2020-09-13 NOTE — Progress Notes (Signed)
Patient come to covid drive through testing site, reported a positive home test on 08/09/20. Pre-op covid test still collected. Informed pt that depending on the results of this covid test, it will determine how the provider will proceed.

## 2020-09-13 NOTE — Progress Notes (Signed)
PCP - DR Madalyn Rob [place] Cardiologist - NA    Chest x-ray - DOS EKG - 09/13/20 Stress Test - NQ ECHO - NA Cardiac Cath - NA   Aspirin Instructions:STOP  ERAS Protcol -NA PRE-SURGERY Ensure or G2- NA  COVID TEST- 09/13/20   Anesthesia review: HEART HX  Patient denies shortness of breath, fever, cough and chest pain at PAT appointment   All instructions explained to the patient, with a verbal understanding of the material. Patient agrees to go over the instructions while at home for a better understanding. Patient also instructed to self quarantine after being tested for COVID-19. The opportunity to ask questions was provided.

## 2020-09-14 NOTE — Anesthesia Preprocedure Evaluation (Addendum)
Anesthesia Evaluation  Patient identified by MRN, date of birth, ID band Patient awake    Reviewed: Allergy & Precautions, H&P , NPO status , Patient's Chart, lab work & pertinent test results  Airway Mallampati: II   Neck ROM: full    Dental   Pulmonary COPD, former smoker,    breath sounds clear to auscultation       Cardiovascular hypertension, + CAD and + Cardiac Stents   Rhythm:regular Rate:Normal  RCA stent placed 2014.   Neuro/Psych  Headaches, Anxiety  Neuromuscular disease    GI/Hepatic   Endo/Other    Renal/GU      Musculoskeletal  (+) Arthritis ,   Abdominal   Peds  Hematology   Anesthesia Other Findings   Reproductive/Obstetrics                            Anesthesia Physical Anesthesia Plan  ASA: III  Anesthesia Plan: General   Post-op Pain Management:    Induction: Intravenous  PONV Risk Score and Plan: 2 and Ondansetron, Dexamethasone, Midazolam and Treatment may vary due to age or medical condition  Airway Management Planned: Oral ETT  Additional Equipment:   Intra-op Plan:   Post-operative Plan: Extubation in OR  Informed Consent: I have reviewed the patients History and Physical, chart, labs and discussed the procedure including the risks, benefits and alternatives for the proposed anesthesia with the patient or authorized representative who has indicated his/her understanding and acceptance.       Plan Discussed with: CRNA, Anesthesiologist and Surgeon  Anesthesia Plan Comments: (PAT note by Karoline Caldwell, PA-C:  Follows with cardiology at [facility] for history of CAD status post NSTEMI 2014 treated with PTCA to proximal RCA occlusion.  Last seen 07/25/2019, stable at that time.  Patient denied any anginal symptoms at preadmission testing appointment.  Former smoker (quit 2014) with history of COPD.  Preop labs reviewed, platelets mildly low  at 130, hemoglobin mildly elevated at 18.9, otherwise unremarkable.  EKG 09/13/2020: Normal sinus rhythm.  Rate 73. Septal infarct , age undetermined. No significant change since last tracing  CT chest 07/03/2020:  Impression: One.  Multiple old right rib fractures, one of which is healed well the remainder are nonunited. 2.  Centrilobular groundglass nodularity in the upper lobes which can be seen with respiratory bronchiolitis and acute to subacute hypersensitivity pneumonitis.  Cath and PCI 01/10/2013: Impression: 1. NSTEMI secondary to total occlusion of the proximal RCA 2. PTCA of the proximal RCA with balloon angioplasty only. The RCA was found to be small in caliber and diffusely diseased from the proximal vessel throughout the mid and distal vessel and into the PDA.   Recommendations: I would treat him with dual anti-platelet therapy (ASA/Effient) for at least one month. Continue statin and beta blocker.   Diagnostic Cath 01/10/13: Conclusions:   1. 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  The occlusion appears to be recent. 2. Overall well preserved LV function with  A small area of hypokinesis of the mid inferior wall.   I have discussed the case with Dr. Julianne Handler who will proceed with PCI of the RCA .  )       Anesthesia Quick Evaluation

## 2020-09-14 NOTE — Progress Notes (Signed)
Anesthesia Chart Review:  Follows with cardiology at [facility] for history of CAD status post NSTEMI 2014 treated with PTCA to proximal RCA occlusion.  Last seen 07/25/2019, stable at that time.  Patient denied any anginal symptoms at preadmission testing appointment.  Former smoker (quit 2014) with history of COPD.  Preop labs reviewed, platelets mildly low at 130, hemoglobin mildly elevated at 18.9, otherwise unremarkable.  EKG 09/13/2020: Normal sinus rhythm.  Rate 73. Septal infarct , age undetermined. No significant change since last tracing  CT chest 07/03/2020:  Impression: One.  Multiple old right rib fractures, one of which is healed well the remainder are nonunited. 2.  Centrilobular groundglass nodularity in the upper lobes which can be seen with respiratory bronchiolitis and acute to subacute hypersensitivity pneumonitis.  Cath and PCI 01/10/2013: Impression: 1. NSTEMI secondary to total occlusion of the proximal RCA 2. PTCA of the proximal RCA with balloon angioplasty only. The RCA was found to be small in caliber and diffusely diseased from the proximal vessel throughout the mid and distal vessel and into the PDA.   Recommendations: I would treat him with dual anti-platelet therapy (ASA/Effient) for at least one month. Continue statin and beta blocker.   Diagnostic Cath 01/10/13: Conclusions:   1. 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  The occlusion appears to be recent. 2. Overall well preserved LV function with  A small area of hypokinesis of the mid inferior wall.   I have discussed the case with Dr. Julianne Handler who will proceed with PCI of the RCA .    Wynonia Musty [facility] Short Stay Center/Anesthesiology Phone [phone] 09/14/2020 4:00 PM

## 2020-09-17 ENCOUNTER — Other Ambulatory Visit: Payer: Self-pay

## 2020-09-17 ENCOUNTER — Ambulatory Visit (HOSPITAL_COMMUNITY): Payer: Worker's Compensation | Admitting: Certified Registered"

## 2020-09-17 ENCOUNTER — Encounter (HOSPITAL_COMMUNITY)
Admission: AD | Disposition: A | Discharge status: Home / Self Care | Payer: Self-pay | Source: Home / Self Care | Attending: Thoracic Surgery (Cardiothoracic Vascular Surgery)

## 2020-09-17 ENCOUNTER — Ambulatory Visit (HOSPITAL_COMMUNITY): Payer: Worker's Compensation | Admitting: Physician Assistant

## 2020-09-17 ENCOUNTER — Inpatient Hospital Stay (HOSPITAL_COMMUNITY): Payer: Worker's Compensation

## 2020-09-17 ENCOUNTER — Encounter (HOSPITAL_COMMUNITY): Payer: Self-pay | Admitting: Thoracic Surgery (Cardiothoracic Vascular Surgery)

## 2020-09-17 ENCOUNTER — Inpatient Hospital Stay (HOSPITAL_COMMUNITY)
Admission: AD | Admit: 2020-09-17 | Discharge: 2020-09-18 | DRG: 516 | Disposition: A | Discharge status: Home / Self Care | Payer: Worker's Compensation | Attending: Thoracic Surgery (Cardiothoracic Vascular Surgery) | Admitting: Thoracic Surgery (Cardiothoracic Vascular Surgery)

## 2020-09-17 ENCOUNTER — Ambulatory Visit (HOSPITAL_COMMUNITY): Payer: Worker's Compensation

## 2020-09-17 DIAGNOSIS — Z9861 Coronary angioplasty status: Secondary | ICD-10-CM | POA: Diagnosis not present

## 2020-09-17 DIAGNOSIS — Z825 Family history of asthma and other chronic lower respiratory diseases: Secondary | ICD-10-CM | POA: Diagnosis not present

## 2020-09-17 DIAGNOSIS — Z813 Family history of other psychoactive substance abuse and dependence: Secondary | ICD-10-CM

## 2020-09-17 DIAGNOSIS — J449 Chronic obstructive pulmonary disease, unspecified: Secondary | ICD-10-CM | POA: Diagnosis present

## 2020-09-17 DIAGNOSIS — Z7902 Long term (current) use of antithrombotics/antiplatelets: Secondary | ICD-10-CM | POA: Diagnosis not present

## 2020-09-17 DIAGNOSIS — F17210 Nicotine dependence, cigarettes, uncomplicated: Secondary | ICD-10-CM | POA: Diagnosis present

## 2020-09-17 DIAGNOSIS — F419 Anxiety disorder, unspecified: Secondary | ICD-10-CM | POA: Diagnosis present

## 2020-09-17 DIAGNOSIS — S2241XA Multiple fractures of ribs, right side, initial encounter for closed fracture: Secondary | ICD-10-CM | POA: Diagnosis not present

## 2020-09-17 DIAGNOSIS — I10 Essential (primary) hypertension: Secondary | ICD-10-CM | POA: Diagnosis present

## 2020-09-17 DIAGNOSIS — J939 Pneumothorax, unspecified: Secondary | ICD-10-CM | POA: Diagnosis not present

## 2020-09-17 DIAGNOSIS — I2510 Atherosclerotic heart disease of native coronary artery without angina pectoris: Secondary | ICD-10-CM | POA: Diagnosis present

## 2020-09-17 DIAGNOSIS — I252 Old myocardial infarction: Secondary | ICD-10-CM | POA: Diagnosis not present

## 2020-09-17 DIAGNOSIS — Z79899 Other long term (current) drug therapy: Secondary | ICD-10-CM | POA: Diagnosis not present

## 2020-09-17 DIAGNOSIS — S2241XK Multiple fractures of ribs, right side, subsequent encounter for fracture with nonunion: Principal | ICD-10-CM

## 2020-09-17 DIAGNOSIS — S2249XA Multiple fractures of ribs, unspecified side, initial encounter for closed fracture: Secondary | ICD-10-CM

## 2020-09-17 DIAGNOSIS — M7501 Adhesive capsulitis of right shoulder: Secondary | ICD-10-CM | POA: Diagnosis present

## 2020-09-17 DIAGNOSIS — Z823 Family history of stroke: Secondary | ICD-10-CM | POA: Diagnosis not present

## 2020-09-17 DIAGNOSIS — R202 Paresthesia of skin: Secondary | ICD-10-CM | POA: Diagnosis present

## 2020-09-17 DIAGNOSIS — Y92410 Unspecified street and highway as the place of occurrence of the external cause: Secondary | ICD-10-CM | POA: Diagnosis not present

## 2020-09-17 DIAGNOSIS — Z4682 Encounter for fitting and adjustment of non-vascular catheter: Secondary | ICD-10-CM

## 2020-09-17 DIAGNOSIS — Z8249 Family history of ischemic heart disease and other diseases of the circulatory system: Secondary | ICD-10-CM

## 2020-09-17 DIAGNOSIS — E785 Hyperlipidemia, unspecified: Secondary | ICD-10-CM | POA: Diagnosis present

## 2020-09-17 DIAGNOSIS — S2231XG Fracture of one rib, right side, subsequent encounter for fracture with delayed healing: Secondary | ICD-10-CM

## 2020-09-17 HISTORY — PX: RIB PLATING: SHX5079

## 2020-09-17 LAB — COMPREHENSIVE METABOLIC PANEL
ALT: 25 U/L (ref 0–44)
AST: 25 U/L (ref 15–41)
Albumin: 3.8 g/dL (ref 3.5–5.0)
Alkaline Phosphatase: 40 U/L (ref 38–126)
Anion gap: 12 (ref 5–15)
BUN: 10 mg/dL (ref 6–20)
CO2: 24 mmol/L (ref 22–32)
Calcium: 9 mg/dL (ref 8.9–10.3)
Chloride: 102 mmol/L (ref 98–111)
Creatinine, Ser: 0.84 mg/dL (ref 0.61–1.24)
GFR, Estimated: >60 mL/min (ref >60)
Glucose, Bld: 102 mg/dL — ABNORMAL HIGH (ref 70–99)
Potassium: 4.1 mmol/L (ref 3.5–5.1)
Sodium: 138 mmol/L (ref 135–145)
Total Bilirubin: 0.9 mg/dL (ref 0.3–1.2)
Total Protein: 6.5 g/dL (ref 6.5–8.1)

## 2020-09-17 SURGERY — FIXATION, RIB, USING PLATE
Anesthesia: General | Site: Chest | Laterality: Right

## 2020-09-17 MED ORDER — PHENYLEPHRINE HCL-NACL 10-0.9 MG/250ML-% IV SOLN
INTRAVENOUS | Status: DC | PRN
  Administered 2020-09-17: 10 ug/min via INTRAVENOUS

## 2020-09-17 MED ORDER — ONDANSETRON HCL 4 MG/2ML IJ SOLN
INTRAMUSCULAR | Status: AC
  Filled 2020-09-17: qty 2

## 2020-09-17 MED ORDER — ALBUMIN HUMAN 5 % IV SOLN: INTRAVENOUS | Status: DC | PRN

## 2020-09-17 MED ORDER — KETAMINE HCL 50 MG/5ML IJ SOSY
PREFILLED_SYRINGE | INTRAMUSCULAR | Status: AC
  Filled 2020-09-17: qty 5

## 2020-09-17 MED ORDER — ARTIFICIAL TEARS OPHTHALMIC OINT
TOPICAL_OINTMENT | OPHTHALMIC | Status: AC
  Filled 2020-09-17: qty 3.5

## 2020-09-17 MED ORDER — LACTATED RINGERS IV SOLN: INTRAVENOUS | Status: DC

## 2020-09-17 MED ORDER — CHLORHEXIDINE GLUCONATE 0.12 % MT SOLN
OROMUCOSAL | Status: AC
  Administered 2020-09-17: 15 mL via OROMUCOSAL
  Filled 2020-09-17: qty 15

## 2020-09-17 MED ORDER — FENTANYL CITRATE (PF) 100 MCG/2ML IJ SOLN
INTRAMUSCULAR | Status: AC
  Filled 2020-09-17: qty 2

## 2020-09-17 MED ORDER — LIDOCAINE HCL (PF) 0.5 % IJ SOLN
INTRAMUSCULAR | Status: AC
  Filled 2020-09-17: qty 50

## 2020-09-17 MED ORDER — OXYCODONE HCL 5 MG PO TABS
ORAL_TABLET | ORAL | Status: AC
  Filled 2020-09-17: qty 1

## 2020-09-17 MED ORDER — PROPOFOL 10 MG/ML IV BOLUS
INTRAVENOUS | Status: DC | PRN
Start: 2020-09-17 — End: 2020-09-17
  Administered 2020-09-17: 160 mg via INTRAVENOUS
  Administered 2020-09-17: 40 mg via INTRAVENOUS

## 2020-09-17 MED ORDER — OXYCODONE HCL 5 MG PO TABS
5.0000 mg | ORAL_TABLET | Freq: Once | ORAL | Status: AC | PRN
  Administered 2020-09-17: 5 mg via ORAL

## 2020-09-17 MED ORDER — LABETALOL HCL 5 MG/ML IV SOLN
5.0000 mg | INTRAVENOUS | Status: DC | PRN
Start: 2020-09-17 — End: 2020-09-17

## 2020-09-17 MED ORDER — KETAMINE HCL 10 MG/ML IJ SOLN
INTRAMUSCULAR | Status: DC | PRN
  Administered 2020-09-17: 20 mg via INTRAVENOUS

## 2020-09-17 MED ORDER — FENTANYL CITRATE (PF) 100 MCG/2ML IJ SOLN
INTRAMUSCULAR | Status: DC | PRN
  Administered 2020-09-17 (×3): 50 ug via INTRAVENOUS
  Administered 2020-09-17: 150 ug via INTRAVENOUS
  Administered 2020-09-17: 50 ug via INTRAVENOUS

## 2020-09-17 MED ORDER — ACETAMINOPHEN 10 MG/ML IV SOLN
INTRAVENOUS | Status: DC | PRN
  Administered 2020-09-17: 1000 mg via INTRAVENOUS

## 2020-09-17 MED ORDER — FENTANYL CITRATE (PF) 250 MCG/5ML IJ SOLN
INTRAMUSCULAR | Status: AC
  Filled 2020-09-17: qty 5

## 2020-09-17 MED ORDER — LISINOPRIL 5 MG PO TABS: 5.0000 mg | ORAL_TABLET | Freq: Every day | ORAL | Status: DC

## 2020-09-17 MED ORDER — PROPOFOL 10 MG/ML IV BOLUS
INTRAVENOUS | Status: AC
  Filled 2020-09-17: qty 40

## 2020-09-17 MED ORDER — ALUM & MAG HYDROXIDE-SIMETH 200-200-20 MG/5ML PO SUSP
15.0000 mL | ORAL | Status: DC | PRN
  Administered 2020-09-17 – 2020-09-18 (×2): 15 mL via ORAL
  Filled 2020-09-17 (×2): qty 30

## 2020-09-17 MED ORDER — PANTOPRAZOLE SODIUM 40 MG PO TBEC
40.0000 mg | DELAYED_RELEASE_TABLET | Freq: Every day | ORAL | Status: DC
  Administered 2020-09-17 – 2020-09-18 (×2): 40 mg via ORAL
  Filled 2020-09-17 (×2): qty 1

## 2020-09-17 MED ORDER — FENTANYL CITRATE (PF) 100 MCG/2ML IJ SOLN
25.0000 ug | INTRAMUSCULAR | Status: DC | PRN
  Administered 2020-09-17 (×4): 25 ug via INTRAVENOUS

## 2020-09-17 MED ORDER — METOPROLOL SUCCINATE ER 50 MG PO TB24: 50.0000 mg | ORAL_TABLET | Freq: Every day | ORAL | Status: DC

## 2020-09-17 MED ORDER — BUPIVACAINE HCL 0.5 % IJ SOLN
INTRAMUSCULAR | Status: AC
  Filled 2020-09-17: qty 1

## 2020-09-17 MED ORDER — LIDOCAINE 2% (20 MG/ML) 5 ML SYRINGE
INTRAMUSCULAR | Status: DC | PRN
  Administered 2020-09-17: 60 mg via INTRAVENOUS

## 2020-09-17 MED ORDER — ONDANSETRON HCL 4 MG/2ML IJ SOLN: 4.0000 mg | Freq: Four times a day (QID) | INTRAMUSCULAR | Status: DC | PRN

## 2020-09-17 MED ORDER — SUGAMMADEX SODIUM 200 MG/2ML IV SOLN
INTRAVENOUS | Status: DC | PRN
  Administered 2020-09-17: 200 mg via INTRAVENOUS

## 2020-09-17 MED ORDER — ACETAMINOPHEN 160 MG/5ML PO SOLN: 1000.0000 mg | Freq: Four times a day (QID) | ORAL | Status: DC

## 2020-09-17 MED ORDER — CEFAZOLIN SODIUM-DEXTROSE 2-4 GM/100ML-% IV SOLN
2.0000 g | INTRAVENOUS | Status: AC
  Administered 2020-09-17: 2 g via INTRAVENOUS
  Filled 2020-09-17: qty 100

## 2020-09-17 MED ORDER — TRAMADOL HCL 50 MG PO TABS
50.0000 mg | ORAL_TABLET | Freq: Four times a day (QID) | ORAL | Status: DC | PRN
Start: 2020-09-17 — End: 2020-09-18
  Administered 2020-09-17: 100 mg via ORAL

## 2020-09-17 MED ORDER — ORAL CARE MOUTH RINSE: 15.0000 mL | Freq: Once | OROMUCOSAL | Status: AC

## 2020-09-17 MED ORDER — PREGABALIN 75 MG PO CAPS
75.0000 mg | ORAL_CAPSULE | Freq: Two times a day (BID) | ORAL | Status: DC
  Administered 2020-09-17 – 2020-09-18 (×2): 75 mg via ORAL
  Filled 2020-09-17 (×2): qty 1

## 2020-09-17 MED ORDER — LIDOCAINE 2% (20 MG/ML) 5 ML SYRINGE
INTRAMUSCULAR | Status: AC
  Filled 2020-09-17: qty 5

## 2020-09-17 MED ORDER — KETOROLAC TROMETHAMINE 15 MG/ML IJ SOLN
15.0000 mg | Freq: Four times a day (QID) | INTRAMUSCULAR | Status: DC
  Administered 2020-09-17 – 2020-09-18 (×4): 15 mg via INTRAVENOUS
  Filled 2020-09-17 (×4): qty 1

## 2020-09-17 MED ORDER — ESMOLOL HCL 100 MG/10ML IV SOLN
INTRAVENOUS | Status: AC
  Filled 2020-09-17: qty 10

## 2020-09-17 MED ORDER — ROCURONIUM BROMIDE 10 MG/ML (PF) SYRINGE
PREFILLED_SYRINGE | INTRAVENOUS | Status: AC
  Filled 2020-09-17: qty 10

## 2020-09-17 MED ORDER — 0.9 % SODIUM CHLORIDE (POUR BTL) OPTIME
TOPICAL | Status: DC | PRN
  Administered 2020-09-17: 2000 mL

## 2020-09-17 MED ORDER — NITROGLYCERIN 0.4 MG SL SUBL: 0.4000 mg | SUBLINGUAL_TABLET | SUBLINGUAL | Status: DC | PRN

## 2020-09-17 MED ORDER — KETOROLAC TROMETHAMINE 30 MG/ML IJ SOLN
INTRAMUSCULAR | Status: DC | PRN
  Administered 2020-09-17: 30 mg via INTRAVENOUS

## 2020-09-17 MED ORDER — MIDAZOLAM HCL 5 MG/5ML IJ SOLN
INTRAMUSCULAR | Status: DC | PRN
  Administered 2020-09-17: 2 mg via INTRAVENOUS

## 2020-09-17 MED ORDER — LACTATED RINGERS IV SOLN: INTRAVENOUS | Status: DC | PRN

## 2020-09-17 MED ORDER — LABETALOL HCL 5 MG/ML IV SOLN
INTRAVENOUS | Status: AC
  Filled 2020-09-17: qty 4

## 2020-09-17 MED ORDER — ONDANSETRON HCL 4 MG/2ML IJ SOLN
INTRAMUSCULAR | Status: DC | PRN
  Administered 2020-09-17: 4 mg via INTRAVENOUS

## 2020-09-17 MED ORDER — BUPIVACAINE LIPOSOME 1.3 % IJ SUSP
20.0000 mL | INTRAMUSCULAR | Status: DC
  Filled 2020-09-17: qty 20

## 2020-09-17 MED ORDER — CEFAZOLIN SODIUM-DEXTROSE 2-4 GM/100ML-% IV SOLN
2.0000 g | Freq: Three times a day (TID) | INTRAVENOUS | Status: AC
  Administered 2020-09-17 (×2): 2 g via INTRAVENOUS
  Filled 2020-09-17 (×2): qty 100

## 2020-09-17 MED ORDER — MIDAZOLAM HCL 2 MG/2ML IJ SOLN
INTRAMUSCULAR | Status: AC
  Filled 2020-09-17: qty 2

## 2020-09-17 MED ORDER — TRAMADOL HCL 50 MG PO TABS
ORAL_TABLET | ORAL | Status: AC
  Filled 2020-09-17: qty 2

## 2020-09-17 MED ORDER — SENNOSIDES-DOCUSATE SODIUM 8.6-50 MG PO TABS: 1.0000 | ORAL_TABLET | Freq: Every day | ORAL | Status: DC

## 2020-09-17 MED ORDER — OXYCODONE HCL 5 MG/5ML PO SOLN: 5.0000 mg | Freq: Once | ORAL | Status: AC | PRN

## 2020-09-17 MED ORDER — ROCURONIUM BROMIDE 10 MG/ML (PF) SYRINGE
PREFILLED_SYRINGE | INTRAVENOUS | Status: AC
  Filled 2020-09-17: qty 20

## 2020-09-17 MED ORDER — CHLORHEXIDINE GLUCONATE 0.12 % MT SOLN: 15.0000 mL | Freq: Once | OROMUCOSAL | Status: AC

## 2020-09-17 MED ORDER — CHLORHEXIDINE GLUCONATE CLOTH 2 % EX PADS
6.0000 | MEDICATED_PAD | Freq: Every day | CUTANEOUS | Status: DC
  Administered 2020-09-17 – 2020-09-18 (×2): 6 via TOPICAL

## 2020-09-17 MED ORDER — FENTANYL CITRATE (PF) 100 MCG/2ML IJ SOLN
12.5000 ug | INTRAMUSCULAR | Status: DC | PRN
Start: 2020-09-17 — End: 2020-09-18
  Administered 2020-09-17 – 2020-09-18 (×4): 25 ug via INTRAVENOUS
  Filled 2020-09-17 (×5): qty 2

## 2020-09-17 MED ORDER — DEXAMETHASONE SODIUM PHOSPHATE 10 MG/ML IJ SOLN
INTRAMUSCULAR | Status: DC | PRN
  Administered 2020-09-17: 10 mg via INTRAVENOUS

## 2020-09-17 MED ORDER — LABETALOL HCL 5 MG/ML IV SOLN: 10.0000 mg | INTRAVENOUS | Status: DC | PRN

## 2020-09-17 MED ORDER — ACETAMINOPHEN 500 MG PO TABS
1000.0000 mg | ORAL_TABLET | Freq: Four times a day (QID) | ORAL | Status: DC
  Administered 2020-09-17 – 2020-09-18 (×4): 1000 mg via ORAL
  Filled 2020-09-17 (×4): qty 2

## 2020-09-17 MED ORDER — METOPROLOL TARTRATE 5 MG/5ML IV SOLN
INTRAVENOUS | Status: DC | PRN
  Administered 2020-09-17: 2.5 mg via INTRAVENOUS

## 2020-09-17 MED ORDER — DEXAMETHASONE SODIUM PHOSPHATE 10 MG/ML IJ SOLN
INTRAMUSCULAR | Status: AC
  Filled 2020-09-17: qty 1

## 2020-09-17 MED ORDER — ROCURONIUM BROMIDE 10 MG/ML (PF) SYRINGE
PREFILLED_SYRINGE | INTRAVENOUS | Status: DC | PRN
  Administered 2020-09-17: 70 mg via INTRAVENOUS
  Administered 2020-09-17: 30 mg via INTRAVENOUS
  Administered 2020-09-17: 50 mg via INTRAVENOUS

## 2020-09-17 MED ORDER — ENOXAPARIN SODIUM 40 MG/0.4ML ~~LOC~~ SOLN
40.0000 mg | SUBCUTANEOUS | Status: DC
  Administered 2020-09-17: 40 mg via SUBCUTANEOUS
  Filled 2020-09-17: qty 0.4

## 2020-09-17 MED ORDER — BISACODYL 5 MG PO TBEC
10.0000 mg | DELAYED_RELEASE_TABLET | Freq: Every day | ORAL | Status: DC
  Administered 2020-09-17 – 2020-09-18 (×2): 10 mg via ORAL
  Filled 2020-09-17 (×2): qty 2

## 2020-09-17 MED ORDER — ROSUVASTATIN CALCIUM 20 MG PO TABS: 20.0000 mg | ORAL_TABLET | Freq: Every day | ORAL | Status: DC

## 2020-09-17 MED ORDER — DEXTROSE-NACL 5-0.9 % IV SOLN: INTRAVENOUS | Status: DC

## 2020-09-17 SURGICAL SUPPLY — 73 items
BATTERY MAXDRIVER (MISCELLANEOUS) ×4 IMPLANT
BATTERY PACK STR FOR DRIVER (MISCELLANEOUS) IMPLANT
BENZOIN TINCTURE PRP APPL 2/3 (GAUZE/BANDAGES/DRESSINGS) IMPLANT
BLADE CLIPPER SURG (BLADE) ×2 IMPLANT
BLADE SAW GIGLI 510 (BLADE) IMPLANT
CANISTER SUCT 3000ML PPV (MISCELLANEOUS) ×2 IMPLANT
CATH HYDRAGLIDE XL THORACIC (CATHETERS) IMPLANT
CATH THORACIC 28FR (CATHETERS) IMPLANT
CATH THORACIC 28FR RT ANG (CATHETERS) IMPLANT
CATH THORACIC 36FR (CATHETERS) IMPLANT
CATH THORACIC 36FR RT ANG (CATHETERS) IMPLANT
CLIP VESOCCLUDE MED 6/CT (CLIP) ×2 IMPLANT
CNTNR URN SCR LID CUP LEK RST (MISCELLANEOUS) ×1 IMPLANT
CONT SPEC 4OZ STRL OR WHT (MISCELLANEOUS) ×1
COVER SURGICAL LIGHT HANDLE (MISCELLANEOUS) IMPLANT
DECANTER SPIKE VIAL GLASS SM (MISCELLANEOUS) IMPLANT
DERMABOND ADVANCED (GAUZE/BANDAGES/DRESSINGS)
DERMABOND ADVANCED .7 DNX12 (GAUZE/BANDAGES/DRESSINGS) IMPLANT
DEVICE THORACIC LVL1 TEMP FIX (Screw) ×2 IMPLANT
DRAPE INCISE IOBAN 66X45 STRL (DRAPES) ×2 IMPLANT
DRAPE LAPAROSCOPIC ABDOMINAL (DRAPES) IMPLANT
DRAPE ORTHO SPLIT 77X108 STRL (DRAPES) ×2
DRAPE SLUSH/WARMER DISC (DRAPES) ×2 IMPLANT
DRAPE SURG ORHT 6 SPLT 77X108 (DRAPES) ×2 IMPLANT
DRAPE WARM FLUID 44X44 (DRAPES) IMPLANT
ELECT BLADE 4.0 EZ CLEAN MEGAD (MISCELLANEOUS) ×2
ELECT REM PT RETURN 9FT ADLT (ELECTROSURGICAL) ×2
ELECTRODE BLDE 4.0 EZ CLN MEGD (MISCELLANEOUS) ×1 IMPLANT
ELECTRODE REM PT RTRN 9FT ADLT (ELECTROSURGICAL) ×1 IMPLANT
GAUZE SPONGE 4X4 12PLY STRL (GAUZE/BANDAGES/DRESSINGS) ×2 IMPLANT
GAUZE SPONGE 4X4 12PLY STRL LF (GAUZE/BANDAGES/DRESSINGS) ×2 IMPLANT
GLOVE SURG SIGNA 7.5 PF LTX (GLOVE) ×6 IMPLANT
GOWN STRL REUS W/ TWL LRG LVL3 (GOWN DISPOSABLE) ×2 IMPLANT
GOWN STRL REUS W/TWL LRG LVL3 (GOWN DISPOSABLE) ×2
HEMOSTAT POWDER SURGIFOAM 1G (HEMOSTASIS) IMPLANT
KIT BASIN OR (CUSTOM PROCEDURE TRAY) ×2 IMPLANT
KIT TURNOVER KIT B (KITS) ×2 IMPLANT
NS IRRIG 1000ML POUR BTL (IV SOLUTION) ×4 IMPLANT
PACK CHEST (CUSTOM PROCEDURE TRAY) ×2 IMPLANT
PAD ARMBOARD 7.5X6 YLW CONV (MISCELLANEOUS) ×4 IMPLANT
PLATE LOCK THO 2.3X20X1.5 (Plate) ×5 IMPLANT
PLATE LOCK THO 2.3X20X1.5 TH (Plate) ×1 IMPLANT
PLATE LOCK THO 2.3X20X1.5 TI (Plate) ×4 IMPLANT
SCREW RIB MAXDRIVE 2.3X7 (Screw) ×1 IMPLANT
SCREW RIB MAXDRIVE 2.3X7 STRL (Screw) ×4 IMPLANT
SCREW RIB MAXDRIVE 2.3X7MM (Screw) ×5 IMPLANT
SCREWDRIVER MAXDRIVE 2.0X11.4 ×2 IMPLANT
SUT FIBERWIRE #2 38 T-5 BLUE (SUTURE)
SUT FIBERWIRE #5 38 CONV NDL (SUTURE)
SUT SILK  1 MH (SUTURE) ×1
SUT SILK 0 FSL (SUTURE) IMPLANT
SUT SILK 1 MH (SUTURE) ×1 IMPLANT
SUT SILK 2 0 SH (SUTURE) IMPLANT
SUT VIC AB 1 CTX 18 (SUTURE) IMPLANT
SUT VIC AB 1 CTX 27 (SUTURE) ×4 IMPLANT
SUT VIC AB 1 CTX 36 (SUTURE) ×1
SUT VIC AB 1 CTX36XBRD ANBCTR (SUTURE) ×1 IMPLANT
SUT VIC AB 2-0 CTX 27 (SUTURE) ×2 IMPLANT
SUT VIC AB 2-0 CTX 36 (SUTURE) ×2 IMPLANT
SUT VIC AB 3-0 X1 27 (SUTURE) ×4 IMPLANT
SUT VICRYL 2 TP 1 (SUTURE) ×2 IMPLANT
SUTURE FIBERWR #2 38 T-5 BLUE (SUTURE) IMPLANT
SUTURE FIBERWR #5 38 CONV NDL (SUTURE) IMPLANT
SYR BULB IRRIG 60ML STRL (SYRINGE) ×2 IMPLANT
SYSTEM SAHARA CHEST DRAIN ATS (WOUND CARE) ×2 IMPLANT
TAPE CLOTH 4X10 WHT NS (GAUZE/BANDAGES/DRESSINGS) ×2 IMPLANT
TAPE CLOTH SURG 4X10 WHT LF (GAUZE/BANDAGES/DRESSINGS) ×2 IMPLANT
TOWEL GREEN STERILE (TOWEL DISPOSABLE) ×2 IMPLANT
TOWEL GREEN STERILE FF (TOWEL DISPOSABLE) ×2 IMPLANT
TRAY FOLEY MTR SLVR 16FR STAT (SET/KITS/TRAYS/PACK) ×2 IMPLANT
TUNNELER SHEATH ON-Q 11GX8 DSP (PAIN MANAGEMENT) IMPLANT
TUNNELER SHEATH ON-Q 16GX12 DP (PAIN MANAGEMENT) IMPLANT
WATER STERILE IRR 1000ML POUR (IV SOLUTION) ×4 IMPLANT

## 2020-09-17 NOTE — Progress Notes (Signed)
Radiologist called report on CXR. Pneumothorax noted in the right base, 25-30% per Dr. Jasmine December. Dr. Roxan Hockey notified. Information passed onto bedside RN and will continue to monitor.

## 2020-09-17 NOTE — Anesthesia Procedure Notes (Signed)
Procedure Name: Intubation Performed by: Cleda Daub, CRNA Pre-anesthesia Checklist: Patient identified, Emergency Drugs available, Suction available and Patient being monitored Patient Re-evaluated:Patient Re-evaluated prior to induction Oxygen Delivery Method: Circle system utilized Preoxygenation: Pre-oxygenation with 100% oxygen Induction Type: IV induction Ventilation: Mask ventilation without difficulty Laryngoscope Size: Glidescope and 4 (grade 3 view with MAC 4) Grade View: Grade I Tube type: Oral Endobronchial tube: Left, Double lumen EBT and EBT position confirmed by fiberoptic bronchoscope Number of attempts: 1 Airway Equipment and Method: Stylet and Oral airway Placement Confirmation: ETT inserted through vocal cords under direct vision,  positive ETCO2 and breath sounds checked- equal and bilateral Tube secured with: Tape Dental Injury: Teeth and Oropharynx as per pre-operative assessment
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
## 2020-09-17 NOTE — Procedures (Signed)
      [address]       [place] [address]             [phone]      CXR in PACU showed a right basilar pneumothorax.  Discussed with patient need for chest tube placement. He understands and agrees to proceed.  Sterile technique used. Local anesthesia with 5 ml 1% lidocaine  47F pigtail catheter placed inferior anterolateral right chest. + air leak with cough.  Tolerated well.  Revonda Standard Roxan Hockey, MD [facility] [phone]

## 2020-09-17 NOTE — Progress Notes (Signed)
Admission from PACU by bed awake and alert. 

## 2020-09-17 NOTE — Brief Op Note (Addendum)
09/17/2020  10:31 AM  PATIENT:  [name] [name]  55 y.o. 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   Delay start of Pharmacological VTE agent (>24hrs) due to surgical blood loss or risk of bleeding: yes  COMPLICATIONS: NO KNOWN

## 2020-09-17 NOTE — Plan of Care (Signed)
  Problem: Education: Goal: Knowledge of General Education information will improve Description: Including pain rating scale, medication(s)/side effects and non-pharmacologic comfort measures Outcome: Progressing   Problem: Clinical Measurements: Goal: Diagnostic test results will improve Outcome: Progressing   Problem: Nutrition: Goal: Adequate nutrition will be maintained Outcome: Progressing   Problem: Pain Managment: Goal: General experience of comfort will improve Outcome: Progressing   Problem: Skin Integrity: Goal: Risk for impaired skin integrity will decrease Outcome: Progressing

## 2020-09-17 NOTE — Progress Notes (Signed)
1135 received order for Labetalol 5mg  for elevated bp 173/98 from Dr. Elgie Congo 1140 Bp 151/87

## 2020-09-17 NOTE — Interval H&P Note (Signed)
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  The various methods of treatment have been discussed with the patient and family. After consideration of risks, benefits and other options for treatment, the patient has consented to  Procedure(s): RIB PLATING 5-9 (Right) as a surgical intervention.  The patient's history has been reviewed, patient examined, no change in status, stable for surgery.  I have reviewed the patient's chart and labs.  Questions were answered to the patient's satisfaction.     Melrose Nakayama
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
## 2020-09-17 NOTE — Transfer of Care (Signed)
Immediate Anesthesia Transfer of Care Note  Patient: [name] [name]  Procedure(s) Performed: RIB PLATING 5-9 (Right Chest)  Patient Location: PACU  Anesthesia Type:General  Level of Consciousness: awake, alert , oriented and patient cooperative  Airway & Oxygen Therapy: Patient Spontanous Breathing and Patient connected to face mask oxygen  Post-op Assessment: Report given to RN and Post -op Vital signs reviewed and stable  Post vital signs: Reviewed and stable  Last Vitals:  Vitals Value Taken Time  BP 178/102 09/17/20 1055  Temp    Pulse 95 09/17/20 1058  Resp 17 09/17/20 1058  SpO2 97 % 09/17/20 1058  Vitals shown include unvalidated device data.  Last Pain:  Vitals:   09/17/20 0639  TempSrc:   PainSc: 0-No pain      Patients Stated Pain Goal: 3 (25/42/70 6237)  Complications: No complications documented.
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
## 2020-09-17 NOTE — Discharge Instructions (Signed)

## 2020-09-18 ENCOUNTER — Other Ambulatory Visit (HOSPITAL_COMMUNITY): Payer: Self-pay | Admitting: Surgical

## 2020-09-18 ENCOUNTER — Inpatient Hospital Stay (HOSPITAL_COMMUNITY): Payer: Worker's Compensation

## 2020-09-18 ENCOUNTER — Encounter (HOSPITAL_COMMUNITY): Payer: Self-pay | Admitting: Thoracic Surgery (Cardiothoracic Vascular Surgery)

## 2020-09-18 LAB — CBC
HCT: 46.2 % (ref 39.0–52.0)
Hemoglobin: 15.6 g/dL (ref 13.0–17.0)
MCH: 34.7 pg — ABNORMAL HIGH (ref 26.0–34.0)
MCHC: 33.8 g/dL (ref 30.0–36.0)
MCV: 102.9 fL — ABNORMAL HIGH (ref 80.0–100.0)
Platelets: 130 10*3/uL — ABNORMAL LOW (ref 150–400)
RBC: 4.49 MIL/uL (ref 4.22–5.81)
RDW: 13.2 % (ref 11.5–15.5)
WBC: 13.2 10*3/uL — ABNORMAL HIGH (ref 4.0–10.5)
nRBC: 0 % (ref 0.0–0.2)

## 2020-09-18 LAB — BASIC METABOLIC PANEL
Anion gap: 11 (ref 5–15)
BUN: 9 mg/dL (ref 6–20)
CO2: 22 mmol/L (ref 22–32)
Calcium: 8.2 mg/dL — ABNORMAL LOW (ref 8.9–10.3)
Chloride: 103 mmol/L (ref 98–111)
Creatinine, Ser: 0.79 mg/dL (ref 0.61–1.24)
GFR, Estimated: >60 mL/min (ref >60)
Glucose, Bld: 137 mg/dL — ABNORMAL HIGH (ref 70–99)
Potassium: 4.1 mmol/L (ref 3.5–5.1)
Sodium: 136 mmol/L (ref 135–145)

## 2020-09-18 LAB — SURGICAL PATHOLOGY

## 2020-09-18 MED ORDER — OXYCODONE HCL 5 MG PO TABS: 5.0000 mg | ORAL_TABLET | Freq: Four times a day (QID) | ORAL | 0 refills | Status: DC | PRN

## 2020-09-18 MED ORDER — OXYCODONE HCL 5 MG PO TABS: 5.0000 mg | ORAL_TABLET | Freq: Four times a day (QID) | ORAL | Status: DC | PRN

## 2020-09-18 NOTE — TOC Transition Note (Signed)
Transition of Care [facility]) - CM/SW Discharge Note   Patient Details  Name: Makenzie Vittorio MRN: [id number] Date of Birth: Jun 22, 1966  [facility] CM/SW Contact:  Zenon Mayo, RN Phone Number: 09/18/2020, 1:36 PM   Clinical Narrative:    NCM spoke with wife over the phone, she wanted to give me the workers Comp Case Manager name and phone number,  NCM informed her that just spoke with her on the phone. Faxed the OP note , and progress note to [employer] from [employer]. Her fax is [phone].  Her phone is [phone]. Patient has no needs at discharge.    Final next level of care: Home/Self Care Barriers to Discharge: No Barriers Identified   Patient Goals and CMS Choice Patient states their goals for this hospitalization and ongoing recovery are:: go home   Choice offered to / list presented to : NA  Discharge Placement                       Discharge Plan and Services                  DME Agency: NA       HH Arranged: NA          Social Determinants of Health (SDOH) Interventions     Readmission Risk Interventions No flowsheet data found.

## 2020-09-18 NOTE — Progress Notes (Signed)
Radiology made  aware about the need to do chest- x ray after chest tube removal, claimed to come to do it at 11 am.

## 2020-09-18 NOTE — Progress Notes (Signed)
1 Day Post-Op Procedure(s) (LRB): RIB PLATING 5-9 (Right) Subjective: C/o rib pain. Swelling in right arm last night- improved this AM  Objective: Vital signs in last 24 hours: Temp:  [97.2 F (36.2 C)-98.7 F (37.1 C)] 98.7 F (37.1 C) (02/08 0753) Pulse Rate:  [84-104] 84 (02/08 0753) Cardiac Rhythm: Normal sinus rhythm (02/08 0756) Resp:  [12-20] 15 (02/08 0753) BP: (123-178)/(75-111) 144/86 (02/08 0753) SpO2:  [91 %-98 %] 98 % (02/08 0753)  Hemodynamic parameters for last 24 hours:    Intake/Output from previous day: 02/07 0701 - 02/08 0700 In: 4023.6 [P.O.:440; I.V.:2933.6; IV Piggyback:650] Out: 1398 [Urine:1370; Blood:20; Chest Tube:8] Intake/Output this shift: Total I/O In: -  Out: 6 [Chest Tube:6]  General appearance: alert, cooperative and no distress Neurologic: intact Heart: regular rate and rhythm Lungs: clear to auscultation bilaterally no air leak  Lab Results: Recent Labs    09/18/20 0038  WBC 13.2*  HGB 15.6  HCT 46.2  PLT 130*   BMET:  Recent Labs    09/17/20 0555 09/18/20 0038  NA 138 136  K 4.1 4.1  CL 102 103  CO2 24 22  GLUCOSE 102* 137*  BUN 10 9  CREATININE 0.84 0.79  CALCIUM 9.0 8.2*    PT/INR: No results for input(s): LABPROT, INR in the last 72 hours. ABG    Component Value Date/Time   PHART 7.394 09/13/2020 1330   HCO3 24.2 09/13/2020 1330   TCO2 28 01/09/2013 1339   ACIDBASEDEF 0.1 09/13/2020 1330   O2SAT 97.3 09/13/2020 1330   CBG (last 3)  No results for input(s): GLUCAP in the last 72 hours.  Assessment/Plan: S/P Procedure(s) (LRB): RIB PLATING 5-9 (Right) -CXR with no pneumothorax, no air leak with repeated coughing- will dc chest tube Repeat CXR after tube removed Ambulate Toradol, acetaminophen, Lyrica and oxycodone PRN for pain Possibly home this evening if pain improves and no issues after CT removed    LOS: 1 day    [name] [name] 09/18/2020

## 2020-09-19 ENCOUNTER — Other Ambulatory Visit: Payer: Self-pay | Admitting: *Deleted
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
## 2020-09-19 NOTE — Anesthesia Postprocedure Evaluation (Addendum)
Anesthesia Post Note  Patient: Ferdie Bakken  Procedure(s) Performed: RIB PLATING 5-9 (Right Chest)     Patient location during evaluation: PACU Anesthesia Type: General Level of consciousness: awake and alert Pain management: pain level controlled Vital Signs Assessment: post-procedure vital signs reviewed and stable Respiratory status: spontaneous breathing, nonlabored ventilation, respiratory function stable and patient connected to nasal cannula oxygen Cardiovascular status: blood pressure returned to baseline and stable Postop Assessment: no apparent nausea or vomiting Anesthetic complications: no Comments: CXR in PACU revealed right basilar pneumothorax.  Dr Roxan Hockey addressed this by placing chest tube.   No complications documented.  Last Vitals:  Vitals:   09/18/20 0753 09/18/20 1147  BP: (!) 144/86   Pulse: 84 83  Resp: 15   Temp: 37.1 C   SpO2: 98% 98%    Last Pain:  Vitals:   09/18/20 0756  TempSrc:   PainSc: 0-No pain                 Shona Pardo S

## 2020-09-20 ENCOUNTER — Telehealth: Payer: Self-pay | Admitting: Orthopedic Surgery

## 2020-09-20 NOTE — Telephone Encounter (Signed)
Pts wife angela called asking for a CB in regards to the pts gout in his knee and swelling as well.    [phone]
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
## 2020-09-21 ENCOUNTER — Telehealth: Payer: Self-pay

## 2020-09-21 ENCOUNTER — Other Ambulatory Visit: Payer: Self-pay | Admitting: Surgical

## 2020-09-21 DIAGNOSIS — M25562 Pain in left knee: Secondary | ICD-10-CM | POA: Diagnosis not present

## 2020-09-21 MED ORDER — ALLOPURINOL 100 MG PO TABS: ORAL_TABLET | ORAL | 0 refills | Status: DC

## 2020-09-21 MED ORDER — COLCHICINE 0.6 MG PO TABS: 0.6000 mg | ORAL_TABLET | Freq: Two times a day (BID) | ORAL | 0 refills | Status: DC

## 2020-09-21 NOTE — Telephone Encounter (Signed)
Lvm for pt to call back to inform 

## 2020-09-21 NOTE — Telephone Encounter (Signed)
I told her we didn't have any availability today but that I would make sure you saw the message from yesterday and advise

## 2020-09-21 NOTE — Telephone Encounter (Signed)
Patients wife called regarding last message she is trying to get patient a appointment for today I didnt advise her providers scheudles are filled she would like a call [phone]

## 2020-09-21 NOTE — Telephone Encounter (Signed)
Did not see any med for gout other than allopurinol which wouldn't help very much in acute flare.  Sent in refill for allopurinol and a RX for colchicine which should help more with acute pain

## 2020-09-24 ENCOUNTER — Other Ambulatory Visit: Payer: Self-pay | Admitting: *Deleted
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
## 2020-09-26 ENCOUNTER — Telehealth: Payer: Self-pay | Admitting: *Deleted

## 2020-09-26 ENCOUNTER — Other Ambulatory Visit (HOSPITAL_COMMUNITY): Payer: 59

## 2020-09-26 ENCOUNTER — Other Ambulatory Visit: Payer: Self-pay | Admitting: Physician Assistant

## 2020-09-26 MED ORDER — OXYCODONE-ACETAMINOPHEN 5-325 MG PO TABS: 1.0000 | ORAL_TABLET | ORAL | 0 refills | Status: DC | PRN

## 2020-09-26 MED ORDER — METHOCARBAMOL 750 MG PO TABS: 750.0000 mg | ORAL_TABLET | Freq: Three times a day (TID) | ORAL | 1 refills | Status: DC | PRN

## 2020-09-26 NOTE — Progress Notes (Signed)
Pt called office complaining of persistent pain and muscle spasm after rib plating.  Sent Rx's for Percocet and Robaxin.  Macarthur Critchley, PA-C

## 2020-09-26 NOTE — Telephone Encounter (Signed)
Pt's wife, Levada Dy, contacted the office for Mr. Garriga in regards to pain medication refill. Pt is s/p rib plating 09/17/20. Pt's wife states pt is also experiencing muscle spasms. Per Macarthur Critchley, PA, pain medication refilled along with prescription for Robaxin. Levada Dy made aware.

## 2020-09-27 ENCOUNTER — Other Ambulatory Visit: Payer: Self-pay | Admitting: *Deleted
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
## 2020-10-04 ENCOUNTER — Other Ambulatory Visit (HOSPITAL_COMMUNITY): Payer: Self-pay | Admitting: Orthopedic Surgery

## 2020-10-08 ENCOUNTER — Other Ambulatory Visit: Payer: Self-pay | Admitting: Thoracic Surgery (Cardiothoracic Vascular Surgery)

## 2020-10-08 DIAGNOSIS — S2241XS Multiple fractures of ribs, right side, sequela: Secondary | ICD-10-CM

## 2020-10-09 ENCOUNTER — Ambulatory Visit (INDEPENDENT_AMBULATORY_CARE_PROVIDER_SITE_OTHER): Payer: Self-pay | Admitting: Thoracic Surgery (Cardiothoracic Vascular Surgery)

## 2020-10-09 ENCOUNTER — Encounter: Payer: Self-pay | Admitting: Thoracic Surgery (Cardiothoracic Vascular Surgery)

## 2020-10-09 ENCOUNTER — Other Ambulatory Visit: Payer: Self-pay

## 2020-10-09 ENCOUNTER — Other Ambulatory Visit: Payer: Self-pay | Admitting: Thoracic Surgery (Cardiothoracic Vascular Surgery)

## 2020-10-09 ENCOUNTER — Ambulatory Visit
Admission: RE | Admit: 2020-10-09 | Discharge: 2020-10-09 | Disposition: A | Discharge status: Home / Self Care | Payer: Self-pay | Source: Ambulatory Visit | Attending: Thoracic Surgery (Cardiothoracic Vascular Surgery) | Admitting: Thoracic Surgery (Cardiothoracic Vascular Surgery)

## 2020-10-09 VITALS — BP 161/92 | HR 80 | Temp 98.1°F | Resp 20 | Ht 72.0 in | Wt 202.2 lb

## 2020-10-09 DIAGNOSIS — Z09 Encounter for follow-up examination after completed treatment for conditions other than malignant neoplasm: Secondary | ICD-10-CM

## 2020-10-09 DIAGNOSIS — S2231XG Fracture of one rib, right side, subsequent encounter for fracture with delayed healing: Secondary | ICD-10-CM

## 2020-10-09 DIAGNOSIS — S2241XS Multiple fractures of ribs, right side, sequela: Secondary | ICD-10-CM

## 2020-10-09 DIAGNOSIS — S2241XA Multiple fractures of ribs, right side, initial encounter for closed fracture: Secondary | ICD-10-CM | POA: Diagnosis not present

## 2020-10-09 MED ORDER — OXYCODONE HCL 5 MG PO TABS: 5.0000 mg | ORAL_TABLET | Freq: Four times a day (QID) | ORAL | 0 refills | Status: DC | PRN
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
## 2020-10-18 ENCOUNTER — Other Ambulatory Visit: Payer: Self-pay | Admitting: *Deleted
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
## 2020-10-23 ENCOUNTER — Other Ambulatory Visit: Payer: Self-pay

## 2020-10-23 ENCOUNTER — Other Ambulatory Visit: Payer: Self-pay | Admitting: Thoracic Surgery (Cardiothoracic Vascular Surgery)

## 2020-10-23 ENCOUNTER — Ambulatory Visit (INDEPENDENT_AMBULATORY_CARE_PROVIDER_SITE_OTHER): Payer: Self-pay | Admitting: Thoracic Surgery (Cardiothoracic Vascular Surgery)

## 2020-10-23 VITALS — BP 116/69 | HR 76 | Resp 20 | Ht 72.0 in | Wt 198.0 lb

## 2020-10-23 DIAGNOSIS — Z09 Encounter for follow-up examination after completed treatment for conditions other than malignant neoplasm: Secondary | ICD-10-CM

## 2020-10-23 DIAGNOSIS — S2231XG Fracture of one rib, right side, subsequent encounter for fracture with delayed healing: Secondary | ICD-10-CM

## 2020-10-23 MED ORDER — OXYCODONE HCL 5 MG PO TABS: 5.0000 mg | ORAL_TABLET | Freq: Four times a day (QID) | ORAL | 0 refills | Status: DC | PRN
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
## 2020-10-28 ENCOUNTER — Other Ambulatory Visit (HOSPITAL_COMMUNITY): Payer: Self-pay | Admitting: Orthopedic Surgery

## 2020-10-30 ENCOUNTER — Other Ambulatory Visit (HOSPITAL_COMMUNITY): Payer: Self-pay | Admitting: Orthopedic Surgery

## 2020-11-30 ENCOUNTER — Other Ambulatory Visit (HOSPITAL_COMMUNITY): Payer: Self-pay

## 2020-11-30 MED ORDER — PREDNISONE 10 MG PO TABS
ORAL_TABLET | ORAL | 0 refills | Status: DC
  Filled 2020-11-30: qty 21, 12d supply, fill #0

## 2020-12-20 ENCOUNTER — Other Ambulatory Visit: Payer: Self-pay | Admitting: Thoracic Surgery (Cardiothoracic Vascular Surgery)

## 2020-12-20 DIAGNOSIS — S2249XA Multiple fractures of ribs, unspecified side, initial encounter for closed fracture: Secondary | ICD-10-CM

## 2020-12-24 ENCOUNTER — Other Ambulatory Visit (HOSPITAL_COMMUNITY): Payer: Self-pay

## 2020-12-24 ENCOUNTER — Other Ambulatory Visit (HOSPITAL_COMMUNITY): Payer: Self-pay | Admitting: Orthopedic Surgery

## 2020-12-24 MED ORDER — PREGABALIN 150 MG PO CAPS
150.0000 mg | ORAL_CAPSULE | Freq: Two times a day (BID) | ORAL | 0 refills | Status: DC
  Filled 2020-12-24: qty 60, 30d supply, fill #0

## 2020-12-25 ENCOUNTER — Other Ambulatory Visit: Payer: Self-pay

## 2020-12-25 ENCOUNTER — Other Ambulatory Visit (HOSPITAL_COMMUNITY): Payer: Self-pay | Admitting: Orthopedic Surgery

## 2020-12-25 ENCOUNTER — Ambulatory Visit
Admission: RE | Admit: 2020-12-25 | Discharge: 2020-12-25 | Disposition: A | Discharge status: Home / Self Care | Payer: Self-pay | Source: Ambulatory Visit | Attending: Thoracic Surgery (Cardiothoracic Vascular Surgery) | Admitting: Thoracic Surgery (Cardiothoracic Vascular Surgery)

## 2020-12-25 ENCOUNTER — Encounter: Payer: Self-pay | Admitting: Thoracic Surgery (Cardiothoracic Vascular Surgery)

## 2020-12-25 ENCOUNTER — Ambulatory Visit (INDEPENDENT_AMBULATORY_CARE_PROVIDER_SITE_OTHER): Payer: Worker's Compensation | Admitting: Thoracic Surgery (Cardiothoracic Vascular Surgery)

## 2020-12-25 ENCOUNTER — Other Ambulatory Visit (HOSPITAL_COMMUNITY): Payer: Self-pay

## 2020-12-25 VITALS — BP 158/91 | HR 79 | Resp 20 | Ht 72.0 in | Wt 204.8 lb

## 2020-12-25 DIAGNOSIS — R0781 Pleurodynia: Secondary | ICD-10-CM

## 2020-12-25 DIAGNOSIS — S2241XA Multiple fractures of ribs, right side, initial encounter for closed fracture: Secondary | ICD-10-CM | POA: Diagnosis not present

## 2020-12-25 DIAGNOSIS — S2249XA Multiple fractures of ribs, unspecified side, initial encounter for closed fracture: Secondary | ICD-10-CM

## 2020-12-25 DIAGNOSIS — M47814 Spondylosis without myelopathy or radiculopathy, thoracic region: Secondary | ICD-10-CM | POA: Diagnosis not present

## 2020-12-25 DIAGNOSIS — I517 Cardiomegaly: Secondary | ICD-10-CM | POA: Diagnosis not present

## 2020-12-25 MED ORDER — OXYCODONE HCL 5 MG PO TABS
5.0000 mg | ORAL_TABLET | Freq: Four times a day (QID) | ORAL | 0 refills | Status: DC | PRN
  Filled 2020-12-25: qty 20, 5d supply, fill #0

## 2020-12-25 MED ORDER — PREGABALIN 150 MG PO CAPS
300.0000 mg | ORAL_CAPSULE | Freq: Two times a day (BID) | ORAL | 0 refills | Status: DC
  Filled 2020-12-25: qty 120, 30d supply, fill #0
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
## 2020-12-26 ENCOUNTER — Other Ambulatory Visit (HOSPITAL_COMMUNITY): Payer: Self-pay

## 2020-12-26 MED ORDER — PREGABALIN 150 MG PO CAPS
150.0000 mg | ORAL_CAPSULE | Freq: Two times a day (BID) | ORAL | 0 refills | Status: DC
  Filled 2020-12-26 – 2021-01-24 (×2): qty 60, 30d supply, fill #0

## 2021-01-24 ENCOUNTER — Other Ambulatory Visit (HOSPITAL_COMMUNITY): Payer: Self-pay

## 2021-01-25 ENCOUNTER — Other Ambulatory Visit (HOSPITAL_COMMUNITY): Payer: Self-pay

## 2021-01-28 ENCOUNTER — Other Ambulatory Visit (HOSPITAL_COMMUNITY): Payer: Self-pay

## 2021-01-29 ENCOUNTER — Other Ambulatory Visit: Payer: Self-pay | Admitting: *Deleted

## 2021-01-29 ENCOUNTER — Ambulatory Visit (INDEPENDENT_AMBULATORY_CARE_PROVIDER_SITE_OTHER): Payer: Worker's Compensation | Admitting: Thoracic Surgery (Cardiothoracic Vascular Surgery)

## 2021-01-29 ENCOUNTER — Other Ambulatory Visit: Payer: Self-pay

## 2021-01-29 VITALS — BP 135/87 | HR 79 | Resp 20 | Ht 72.0 in | Wt 204.0 lb

## 2021-01-29 DIAGNOSIS — S2249XA Multiple fractures of ribs, unspecified side, initial encounter for closed fracture: Secondary | ICD-10-CM

## 2021-01-29 DIAGNOSIS — R0781 Pleurodynia: Secondary | ICD-10-CM

## 2021-01-29 DIAGNOSIS — S2249XD Multiple fractures of ribs, unspecified side, subsequent encounter for fracture with routine healing: Secondary | ICD-10-CM
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
## 2021-01-30 ENCOUNTER — Other Ambulatory Visit (HOSPITAL_COMMUNITY): Payer: Self-pay

## 2021-01-30 DIAGNOSIS — I10 Essential (primary) hypertension: Secondary | ICD-10-CM | POA: Diagnosis not present

## 2021-01-30 DIAGNOSIS — Z87891 Personal history of nicotine dependence: Secondary | ICD-10-CM | POA: Diagnosis not present

## 2021-01-30 DIAGNOSIS — Z125 Encounter for screening for malignant neoplasm of prostate: Secondary | ICD-10-CM | POA: Diagnosis not present

## 2021-01-30 DIAGNOSIS — R062 Wheezing: Secondary | ICD-10-CM | POA: Diagnosis not present

## 2021-01-30 DIAGNOSIS — I2510 Atherosclerotic heart disease of native coronary artery without angina pectoris: Secondary | ICD-10-CM | POA: Diagnosis not present

## 2021-01-30 DIAGNOSIS — E782 Mixed hyperlipidemia: Secondary | ICD-10-CM | POA: Diagnosis not present

## 2021-01-30 DIAGNOSIS — Z9861 Coronary angioplasty status: Secondary | ICD-10-CM | POA: Diagnosis not present

## 2021-01-30 DIAGNOSIS — F17210 Nicotine dependence, cigarettes, uncomplicated: Secondary | ICD-10-CM | POA: Diagnosis not present

## 2021-01-30 DIAGNOSIS — Z0000 Encounter for general adult medical examination without abnormal findings: Secondary | ICD-10-CM | POA: Diagnosis not present

## 2021-01-30 MED ORDER — LISINOPRIL 5 MG PO TABS
5.0000 mg | ORAL_TABLET | Freq: Every day | ORAL | 3 refills | Status: DC
  Filled 2021-01-30: qty 90, 90d supply, fill #0
  Filled 2021-12-06: qty 30, 30d supply, fill #1

## 2021-01-30 MED ORDER — SILDENAFIL CITRATE 100 MG PO TABS
100.0000 mg | ORAL_TABLET | Freq: Every day | ORAL | 1 refills | Status: DC | PRN
  Filled 2021-01-30: qty 6, 30d supply, fill #0
  Filled 2021-04-12: qty 6, 30d supply, fill #1

## 2021-01-30 MED ORDER — ROSUVASTATIN CALCIUM 20 MG PO TABS
20.0000 mg | ORAL_TABLET | Freq: Every day | ORAL | 3 refills | Status: DC
  Filled 2021-01-30: qty 90, 90d supply, fill #0

## 2021-01-30 MED ORDER — METOPROLOL SUCCINATE ER 50 MG PO TB24
50.0000 mg | ORAL_TABLET | Freq: Every day | ORAL | 3 refills | Status: DC
  Filled 2021-01-30: qty 90, 90d supply, fill #0
  Filled 2021-12-06: qty 30, 30d supply, fill #1

## 2021-02-08 ENCOUNTER — Other Ambulatory Visit: Payer: Self-pay

## 2021-02-19 ENCOUNTER — Encounter: Payer: Self-pay | Admitting: Thoracic Surgery (Cardiothoracic Vascular Surgery)

## 2021-02-19 ENCOUNTER — Ambulatory Visit
Admission: RE | Admit: 2021-02-19 | Discharge: 2021-02-19 | Disposition: A | Discharge status: Home / Self Care | Payer: Self-pay | Source: Ambulatory Visit | Attending: Thoracic Surgery (Cardiothoracic Vascular Surgery) | Admitting: Thoracic Surgery (Cardiothoracic Vascular Surgery)

## 2021-02-19 DIAGNOSIS — S2249XD Multiple fractures of ribs, unspecified side, subsequent encounter for fracture with routine healing: Secondary | ICD-10-CM

## 2021-02-21 ENCOUNTER — Other Ambulatory Visit (HOSPITAL_COMMUNITY): Payer: Self-pay

## 2021-02-22 ENCOUNTER — Other Ambulatory Visit (HOSPITAL_COMMUNITY): Payer: Self-pay

## 2021-02-22 MED ORDER — PREGABALIN 150 MG PO CAPS
150.0000 mg | ORAL_CAPSULE | Freq: Two times a day (BID) | ORAL | 0 refills | Status: DC
  Filled 2021-02-22: qty 60, 30d supply, fill #0

## 2021-02-27 ENCOUNTER — Other Ambulatory Visit (HOSPITAL_COMMUNITY): Payer: Self-pay

## 2021-02-27 MED ORDER — ALPRAZOLAM 1 MG PO TABS
1.0000 mg | ORAL_TABLET | Freq: Every day | ORAL | 2 refills | Status: DC
  Filled 2021-02-27: qty 150, 30d supply, fill #0
  Filled 2021-04-09: qty 150, 30d supply, fill #1
  Filled 2021-05-30: qty 150, 30d supply, fill #2

## 2021-03-05 ENCOUNTER — Other Ambulatory Visit: Payer: Self-pay

## 2021-03-05 ENCOUNTER — Other Ambulatory Visit (HOSPITAL_COMMUNITY): Payer: Self-pay

## 2021-03-05 ENCOUNTER — Ambulatory Visit (INDEPENDENT_AMBULATORY_CARE_PROVIDER_SITE_OTHER): Payer: Worker's Compensation | Admitting: Thoracic Surgery (Cardiothoracic Vascular Surgery)

## 2021-03-05 VITALS — BP 130/78 | HR 76 | Resp 20 | Ht 72.0 in | Wt 206.0 lb

## 2021-03-05 DIAGNOSIS — R0781 Pleurodynia: Secondary | ICD-10-CM

## 2021-03-05 DIAGNOSIS — Z09 Encounter for follow-up examination after completed treatment for conditions other than malignant neoplasm: Secondary | ICD-10-CM | POA: Diagnosis not present

## 2021-03-05 DIAGNOSIS — I10 Essential (primary) hypertension: Secondary | ICD-10-CM | POA: Diagnosis not present

## 2021-03-05 DIAGNOSIS — Z125 Encounter for screening for malignant neoplasm of prostate: Secondary | ICD-10-CM | POA: Diagnosis not present

## 2021-03-05 DIAGNOSIS — I214 Non-ST elevation (NSTEMI) myocardial infarction: Secondary | ICD-10-CM | POA: Diagnosis not present

## 2021-03-05 DIAGNOSIS — E782 Mixed hyperlipidemia: Secondary | ICD-10-CM | POA: Diagnosis not present

## 2021-03-05 MED ORDER — OMEGA-3-ACID ETHYL ESTERS 1 G PO CAPS
2.0000 g | ORAL_CAPSULE | Freq: Two times a day (BID) | ORAL | 6 refills | Status: DC
  Filled 2021-03-05 – 2021-03-19 (×3): qty 360, 90d supply, fill #0

## 2021-03-05 MED ORDER — ROSUVASTATIN CALCIUM 40 MG PO TABS
40.0000 mg | ORAL_TABLET | Freq: Every day | ORAL | 3 refills | Status: DC
  Filled 2021-03-05: qty 90, 90d supply, fill #0
  Filled 2021-12-06: qty 30, 30d supply, fill #1
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
## 2021-03-06 ENCOUNTER — Other Ambulatory Visit (HOSPITAL_COMMUNITY): Payer: Self-pay

## 2021-03-07 ENCOUNTER — Other Ambulatory Visit (HOSPITAL_COMMUNITY): Payer: Self-pay

## 2021-03-12 ENCOUNTER — Other Ambulatory Visit (HOSPITAL_COMMUNITY): Payer: Self-pay

## 2021-03-19 ENCOUNTER — Other Ambulatory Visit (HOSPITAL_COMMUNITY): Payer: Self-pay

## 2021-03-25 ENCOUNTER — Other Ambulatory Visit (HOSPITAL_COMMUNITY): Payer: Self-pay

## 2021-03-25 MED ORDER — HYDROCODONE-ACETAMINOPHEN 10-325 MG PO TABS
1.0000 | ORAL_TABLET | Freq: Four times a day (QID) | ORAL | 0 refills | Status: DC | PRN
  Filled 2021-03-25: qty 20, 5d supply, fill #0

## 2021-03-29 ENCOUNTER — Other Ambulatory Visit (HOSPITAL_COMMUNITY): Payer: Self-pay

## 2021-04-01 ENCOUNTER — Other Ambulatory Visit (HOSPITAL_COMMUNITY): Payer: Self-pay

## 2021-04-01 MED ORDER — PREGABALIN 150 MG PO CAPS
150.0000 mg | ORAL_CAPSULE | Freq: Two times a day (BID) | ORAL | 0 refills | Status: DC
  Filled 2021-04-01: qty 60, 30d supply, fill #0

## 2021-04-02 ENCOUNTER — Ambulatory Visit: Payer: Worker's Compensation | Admitting: Thoracic Surgery (Cardiothoracic Vascular Surgery)

## 2021-04-09 ENCOUNTER — Other Ambulatory Visit (HOSPITAL_COMMUNITY): Payer: Self-pay

## 2021-04-12 ENCOUNTER — Other Ambulatory Visit (HOSPITAL_COMMUNITY): Payer: Self-pay

## 2021-04-29 ENCOUNTER — Other Ambulatory Visit: Payer: Self-pay | Admitting: Thoracic Surgery (Cardiothoracic Vascular Surgery)

## 2021-04-29 DIAGNOSIS — S2249XA Multiple fractures of ribs, unspecified side, initial encounter for closed fracture: Secondary | ICD-10-CM

## 2021-04-30 ENCOUNTER — Ambulatory Visit
Admission: RE | Admit: 2021-04-30 | Discharge: 2021-04-30 | Disposition: A | Discharge status: Home / Self Care | Payer: Self-pay | Source: Ambulatory Visit | Attending: Thoracic Surgery (Cardiothoracic Vascular Surgery) | Admitting: Thoracic Surgery (Cardiothoracic Vascular Surgery)

## 2021-04-30 ENCOUNTER — Other Ambulatory Visit (HOSPITAL_COMMUNITY): Payer: Self-pay

## 2021-04-30 ENCOUNTER — Other Ambulatory Visit: Payer: Self-pay

## 2021-04-30 ENCOUNTER — Ambulatory Visit (INDEPENDENT_AMBULATORY_CARE_PROVIDER_SITE_OTHER): Payer: Worker's Compensation | Admitting: Surgical

## 2021-04-30 VITALS — BP 139/86 | HR 74 | Resp 20 | Wt 207.0 lb

## 2021-04-30 DIAGNOSIS — Z09 Encounter for follow-up examination after completed treatment for conditions other than malignant neoplasm: Secondary | ICD-10-CM

## 2021-04-30 DIAGNOSIS — S2249XA Multiple fractures of ribs, unspecified side, initial encounter for closed fracture: Secondary | ICD-10-CM | POA: Diagnosis not present

## 2021-04-30 MED ORDER — PREGABALIN 150 MG PO CAPS
150.0000 mg | ORAL_CAPSULE | Freq: Two times a day (BID) | ORAL | 0 refills | Status: DC
  Filled 2021-04-30: qty 60, 30d supply, fill #0
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
## 2021-04-30 NOTE — Patient Instructions (Signed)
Continue same

## 2021-05-29 ENCOUNTER — Other Ambulatory Visit (HOSPITAL_COMMUNITY): Payer: Self-pay

## 2021-05-30 ENCOUNTER — Other Ambulatory Visit (HOSPITAL_COMMUNITY): Payer: Self-pay

## 2021-05-30 MED ORDER — PREGABALIN 150 MG PO CAPS
150.0000 mg | ORAL_CAPSULE | Freq: Two times a day (BID) | ORAL | 0 refills | Status: DC
  Filled 2021-05-30: qty 60, 30d supply, fill #0

## 2021-06-28 ENCOUNTER — Other Ambulatory Visit (HOSPITAL_COMMUNITY): Payer: Self-pay

## 2021-06-28 MED ORDER — PREGABALIN 150 MG PO CAPS
150.0000 mg | ORAL_CAPSULE | Freq: Two times a day (BID) | ORAL | 0 refills | Status: DC
  Filled 2021-06-28: qty 60, 30d supply, fill #0

## 2021-07-29 ENCOUNTER — Other Ambulatory Visit (HOSPITAL_COMMUNITY): Payer: Self-pay

## 2021-07-29 MED ORDER — PREGABALIN 150 MG PO CAPS
150.0000 mg | ORAL_CAPSULE | Freq: Two times a day (BID) | ORAL | 1 refills | Status: DC
  Filled 2021-07-29: qty 60, 30d supply, fill #0

## 2021-07-30 ENCOUNTER — Other Ambulatory Visit: Payer: Self-pay

## 2021-07-30 ENCOUNTER — Ambulatory Visit (INDEPENDENT_AMBULATORY_CARE_PROVIDER_SITE_OTHER): Payer: Worker's Compensation | Admitting: Thoracic Surgery (Cardiothoracic Vascular Surgery)

## 2021-07-30 VITALS — BP 155/99 | HR 71 | Resp 20 | Ht 72.0 in | Wt 216.0 lb

## 2021-07-30 DIAGNOSIS — Z09 Encounter for follow-up examination after completed treatment for conditions other than malignant neoplasm: Secondary | ICD-10-CM
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
## 2021-08-21 ENCOUNTER — Other Ambulatory Visit (HOSPITAL_COMMUNITY): Payer: Self-pay

## 2021-08-21 MED ORDER — ALPRAZOLAM 1 MG PO TABS
1.0000 mg | ORAL_TABLET | Freq: Four times a day (QID) | ORAL | 2 refills | Status: DC | PRN
  Filled 2021-08-21: qty 120, 30d supply, fill #0
  Filled 2021-10-13: qty 120, 30d supply, fill #1
  Filled 2021-12-06: qty 120, 30d supply, fill #2

## 2021-08-27 ENCOUNTER — Other Ambulatory Visit (HOSPITAL_COMMUNITY): Payer: Self-pay

## 2021-08-27 MED ORDER — PREGABALIN 150 MG PO CAPS
150.0000 mg | ORAL_CAPSULE | Freq: Two times a day (BID) | ORAL | 1 refills | Status: DC
Start: 2021-08-27 — End: 2022-04-29
  Filled 2021-08-27: qty 60, 30d supply, fill #0
  Filled 2021-11-25: qty 60, 30d supply, fill #1

## 2021-08-29 ENCOUNTER — Other Ambulatory Visit (HOSPITAL_COMMUNITY): Payer: Self-pay

## 2021-09-25 ENCOUNTER — Other Ambulatory Visit (HOSPITAL_COMMUNITY): Payer: Self-pay

## 2021-09-25 MED ORDER — PREGABALIN 150 MG PO CAPS
150.0000 mg | ORAL_CAPSULE | Freq: Two times a day (BID) | ORAL | 1 refills | Status: DC
Start: 2021-09-25 — End: 2022-04-29
  Filled 2021-09-25: qty 60, 30d supply, fill #0

## 2021-10-14 ENCOUNTER — Other Ambulatory Visit (HOSPITAL_COMMUNITY): Payer: Self-pay

## 2021-10-25 ENCOUNTER — Other Ambulatory Visit (HOSPITAL_COMMUNITY): Payer: Self-pay

## 2021-10-28 ENCOUNTER — Other Ambulatory Visit: Payer: Self-pay | Admitting: Thoracic Surgery (Cardiothoracic Vascular Surgery)

## 2021-10-28 DIAGNOSIS — S2249XG Multiple fractures of ribs, unspecified side, subsequent encounter for fracture with delayed healing: Secondary | ICD-10-CM

## 2021-10-29 ENCOUNTER — Other Ambulatory Visit: Payer: Self-pay

## 2021-10-29 ENCOUNTER — Ambulatory Visit (INDEPENDENT_AMBULATORY_CARE_PROVIDER_SITE_OTHER): Payer: Worker's Compensation | Admitting: Thoracic Surgery (Cardiothoracic Vascular Surgery)

## 2021-10-29 ENCOUNTER — Ambulatory Visit
Admission: RE | Admit: 2021-10-29 | Discharge: 2021-10-29 | Disposition: A | Discharge status: Home / Self Care | Payer: Worker's Compensation | Source: Ambulatory Visit | Attending: Thoracic Surgery (Cardiothoracic Vascular Surgery) | Admitting: Thoracic Surgery (Cardiothoracic Vascular Surgery)

## 2021-10-29 VITALS — BP 134/86 | HR 74 | Resp 20 | Ht 72.0 in | Wt 223.0 lb

## 2021-10-29 DIAGNOSIS — Z09 Encounter for follow-up examination after completed treatment for conditions other than malignant neoplasm: Secondary | ICD-10-CM | POA: Diagnosis not present

## 2021-10-29 DIAGNOSIS — S2249XG Multiple fractures of ribs, unspecified side, subsequent encounter for fracture with delayed healing: Secondary | ICD-10-CM
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
## 2021-11-25 ENCOUNTER — Other Ambulatory Visit (HOSPITAL_COMMUNITY): Payer: Self-pay

## 2021-11-25 MED ORDER — PREGABALIN 150 MG PO CAPS: 150.0000 mg | ORAL_CAPSULE | Freq: Two times a day (BID) | ORAL | 1 refills | Status: DC

## 2021-12-06 ENCOUNTER — Other Ambulatory Visit (HOSPITAL_COMMUNITY): Payer: Self-pay

## 2021-12-18 ENCOUNTER — Other Ambulatory Visit (HOSPITAL_COMMUNITY): Payer: Self-pay

## 2021-12-18 MED ORDER — PREGABALIN 150 MG PO CAPS
150.0000 mg | ORAL_CAPSULE | Freq: Two times a day (BID) | ORAL | 1 refills | Status: AC
Start: 2021-12-18 — End: ?
  Filled 2021-12-18 – 2021-12-26 (×2): qty 60, 30d supply, fill #0

## 2021-12-20 ENCOUNTER — Other Ambulatory Visit (HOSPITAL_COMMUNITY): Payer: Self-pay

## 2021-12-26 ENCOUNTER — Other Ambulatory Visit (HOSPITAL_COMMUNITY): Payer: Self-pay

## 2022-02-18 ENCOUNTER — Other Ambulatory Visit (HOSPITAL_COMMUNITY): Payer: Self-pay

## 2022-02-18 MED ORDER — ALPRAZOLAM 1 MG PO TABS
1.0000 mg | ORAL_TABLET | Freq: Four times a day (QID) | ORAL | 0 refills | Status: DC | PRN
  Filled 2022-02-18: qty 120, 30d supply, fill #0

## 2022-02-19 ENCOUNTER — Other Ambulatory Visit: Payer: Self-pay | Admitting: Neurology

## 2022-03-14 ENCOUNTER — Other Ambulatory Visit: Payer: Self-pay | Admitting: Thoracic Surgery (Cardiothoracic Vascular Surgery)

## 2022-03-14 DIAGNOSIS — S2241XK Multiple fractures of ribs, right side, subsequent encounter for fracture with nonunion: Secondary | ICD-10-CM

## 2022-03-18 ENCOUNTER — Other Ambulatory Visit (HOSPITAL_COMMUNITY): Payer: Self-pay

## 2022-03-18 MED ORDER — NITROGLYCERIN 0.4 MG SL SUBL
0.4000 mg | SUBLINGUAL_TABLET | SUBLINGUAL | 1 refills | Status: DC | PRN
  Filled 2022-03-18: qty 25, 5d supply, fill #0

## 2022-03-18 MED ORDER — PROAIR DIGIHALER 108 (90 BASE) MCG/ACT IN AEPB
2.0000 | INHALATION_SPRAY | Freq: Four times a day (QID) | RESPIRATORY_TRACT | 3 refills | Status: DC | PRN
  Filled 2022-03-18: qty 1, 17d supply, fill #0

## 2022-03-18 MED ORDER — SILDENAFIL CITRATE 100 MG PO TABS
100.0000 mg | ORAL_TABLET | ORAL | 0 refills | Status: AC | PRN
  Filled 2022-03-18: qty 6, 30d supply, fill #0
  Filled 2022-08-06: qty 6, 30d supply, fill #1
  Filled 2022-11-25: qty 10, 30d supply, fill #2

## 2022-03-18 MED ORDER — ALLOPURINOL 100 MG PO TABS
100.0000 mg | ORAL_TABLET | Freq: Every day | ORAL | 0 refills | Status: AC
  Filled 2022-03-18: qty 30, 30d supply, fill #0

## 2022-03-18 MED ORDER — LISINOPRIL 5 MG PO TABS
5.0000 mg | ORAL_TABLET | Freq: Every day | ORAL | 0 refills | Status: DC
  Filled 2022-03-18: qty 30, 30d supply, fill #0

## 2022-03-18 MED ORDER — METOPROLOL SUCCINATE ER 50 MG PO TB24
50.0000 mg | ORAL_TABLET | Freq: Every day | ORAL | 0 refills | Status: DC
  Filled 2022-03-18: qty 30, 30d supply, fill #0
  Filled 2022-08-05: qty 30, 30d supply, fill #1
  Filled 2022-11-25: qty 30, 30d supply, fill #2

## 2022-03-18 MED ORDER — OMEPRAZOLE 40 MG PO CPDR
40.0000 mg | DELAYED_RELEASE_CAPSULE | Freq: Every morning | ORAL | 0 refills | Status: DC
  Filled 2022-03-18: qty 30, 30d supply, fill #0

## 2022-03-18 MED ORDER — ROSUVASTATIN CALCIUM 40 MG PO TABS
40.0000 mg | ORAL_TABLET | Freq: Every evening | ORAL | 0 refills | Status: DC
Start: 2022-03-18 — End: 2024-02-29
  Filled 2022-03-18: qty 30, 30d supply, fill #0
  Filled 2022-08-05: qty 30, 30d supply, fill #1
  Filled 2022-11-25: qty 30, 30d supply, fill #2

## 2022-03-18 MED ORDER — OMEGA-3-ACID ETHYL ESTERS 1 G PO CAPS
2.0000 g | ORAL_CAPSULE | Freq: Two times a day (BID) | ORAL | 0 refills | Status: DC
  Filled 2022-03-18: qty 120, 30d supply, fill #0

## 2022-03-18 MED ORDER — ASPIRIN 81 MG PO TBEC
81.0000 mg | DELAYED_RELEASE_TABLET | Freq: Every day | ORAL | 0 refills | Status: AC
  Filled 2022-03-18: qty 30, 30d supply, fill #0

## 2022-03-19 ENCOUNTER — Other Ambulatory Visit (HOSPITAL_COMMUNITY): Payer: Self-pay

## 2022-03-19 MED ORDER — ALBUTEROL SULFATE HFA 108 (90 BASE) MCG/ACT IN AERS
1.0000 | INHALATION_SPRAY | Freq: Three times a day (TID) | RESPIRATORY_TRACT | 0 refills | Status: DC | PRN
  Filled 2022-03-19: qty 6.7, 30d supply, fill #0

## 2022-03-20 ENCOUNTER — Other Ambulatory Visit (HOSPITAL_COMMUNITY): Payer: Self-pay

## 2022-03-20 MED ORDER — ALPRAZOLAM 1 MG PO TABS
1.0000 mg | ORAL_TABLET | Freq: Four times a day (QID) | ORAL | 2 refills | Status: DC | PRN
  Filled 2022-03-20: qty 120, 30d supply, fill #0
  Filled 2022-05-05: qty 120, 30d supply, fill #1
  Filled 2022-06-26: qty 120, 30d supply, fill #2

## 2022-04-29 ENCOUNTER — Ambulatory Visit
Admission: RE | Admit: 2022-04-29 | Discharge: 2022-04-29 | Disposition: A | Discharge status: Home / Self Care | Payer: Worker's Compensation | Source: Ambulatory Visit | Attending: Thoracic Surgery (Cardiothoracic Vascular Surgery) | Admitting: Thoracic Surgery (Cardiothoracic Vascular Surgery)

## 2022-04-29 ENCOUNTER — Ambulatory Visit (INDEPENDENT_AMBULATORY_CARE_PROVIDER_SITE_OTHER): Payer: Worker's Compensation | Admitting: Thoracic Surgery (Cardiothoracic Vascular Surgery)

## 2022-04-29 ENCOUNTER — Encounter: Payer: Self-pay | Admitting: Thoracic Surgery (Cardiothoracic Vascular Surgery)

## 2022-04-29 VITALS — BP 135/86 | HR 86 | Resp 18 | Ht 72.0 in | Wt 214.0 lb

## 2022-04-29 DIAGNOSIS — Z09 Encounter for follow-up examination after completed treatment for conditions other than malignant neoplasm: Secondary | ICD-10-CM | POA: Diagnosis not present

## 2022-04-29 DIAGNOSIS — S2241XK Multiple fractures of ribs, right side, subsequent encounter for fracture with nonunion: Secondary | ICD-10-CM
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
## 2022-05-05 ENCOUNTER — Other Ambulatory Visit (HOSPITAL_COMMUNITY): Payer: Self-pay

## 2022-05-19 ENCOUNTER — Other Ambulatory Visit (HOSPITAL_COMMUNITY): Payer: Self-pay

## 2022-05-19 MED ORDER — TIZANIDINE HCL 4 MG PO TABS
4.0000 mg | ORAL_TABLET | Freq: Two times a day (BID) | ORAL | 1 refills | Status: DC | PRN
Start: 2022-05-19 — End: 2023-06-17
  Filled 2022-05-19: qty 60, 30d supply, fill #0

## 2022-06-07 ENCOUNTER — Emergency Department (HOSPITAL_BASED_OUTPATIENT_CLINIC_OR_DEPARTMENT_OTHER)
Admission: EM | Admit: 2022-06-07 | Discharge: 2022-06-07 | Disposition: A | Discharge status: Home / Self Care | Payer: 59 | Attending: Emergency Medicine | Admitting: Emergency Medicine

## 2022-06-07 ENCOUNTER — Other Ambulatory Visit: Payer: Self-pay

## 2022-06-07 ENCOUNTER — Encounter (HOSPITAL_BASED_OUTPATIENT_CLINIC_OR_DEPARTMENT_OTHER): Payer: Self-pay | Admitting: Emergency Medicine

## 2022-06-07 ENCOUNTER — Emergency Department (HOSPITAL_COMMUNITY): Admission: EM | Admit: 2022-06-07 | Discharge: 2022-06-07 | Discharge status: Against Medical Advice | Payer: 59

## 2022-06-07 DIAGNOSIS — M79675 Pain in left toe(s): Secondary | ICD-10-CM

## 2022-06-07 DIAGNOSIS — L03116 Cellulitis of left lower limb: Secondary | ICD-10-CM | POA: Insufficient documentation

## 2022-06-07 DIAGNOSIS — Z7982 Long term (current) use of aspirin: Secondary | ICD-10-CM | POA: Insufficient documentation

## 2022-06-07 DIAGNOSIS — L0390 Cellulitis, unspecified: Secondary | ICD-10-CM

## 2022-06-07 MED ORDER — DOXYCYCLINE HYCLATE 100 MG PO CAPS
100.0000 mg | ORAL_CAPSULE | Freq: Two times a day (BID) | ORAL | 0 refills | Status: DC
Start: 2022-06-07 — End: 2022-06-25

## 2022-06-07 MED ORDER — OXYCODONE HCL 5 MG PO TABS: 5.0000 mg | ORAL_TABLET | Freq: Four times a day (QID) | ORAL | 0 refills | Status: DC | PRN

## 2022-06-07 NOTE — ED Provider Notes (Signed)
[facility] EMERGENCY DEPARTMENT Provider Note   CSN: [id number] Arrival date & time: 06/07/22  1614     History  Chief Complaint  Patient presents with   Toe Pain    [name] [name] is a 56 y.o. male.  Patient here with left pinky toe pain last several days.  Went to orthopedics today that did not x-ray and said no fracture.  They sent for further evaluation.  They thought maybe he needed an MRI.  Patient has history of gout.  Denies any trauma.  States that he thinks that his left pinky toe is irritated from work boots that he wears.  He denies any fevers or chills.  No history of diabetes.  Nothing makes it worse or better.  The history is provided by the patient.       Home Medications Prior to Admission medications   Medication Sig Start Date End Date Taking? Authorizing Provider  doxycycline (VIBRAMYCIN) 100 MG capsule Take 1 capsule (100 mg total) by mouth 2 (two) times daily. 06/07/22  Yes Alicia Ackert, DO  oxyCODONE (ROXICODONE) 5 MG immediate release tablet Take 1 tablet (5 mg total) by mouth every 6 (six) hours as needed for up to 10 doses for severe pain or breakthrough pain. 06/07/22  Yes Rebeckah Masih, DO  albuterol (VENTOLIN HFA) 108 (90 Base) MCG/ACT inhaler Inhale 1 puff into the lungs 3 (three) times daily as needed. 03/19/22     allopurinol (ZYLOPRIM) 100 MG tablet Take 1 tablet (100 mg total) by mouth daily. 03/18/22     ALPRAZolam (XANAX) 1 MG tablet Take 1 tablet (1 mg total) by mouth 4 (four) times daily as needed. 03/20/22     aspirin EC (ASPIRIN 81) 81 MG tablet Take 1 tablet (81 mg total) by mouth daily with food 03/18/22     lisinopril (ZESTRIL) 5 MG tablet Take 1 tablet (5 mg total) by mouth daily. 03/18/22     metoprolol succinate (TOPROL-XL) 50 MG 24 hr tablet Take 1 tablet (50 mg total) by mouth daily. 03/18/22     nitroGLYCERIN (NITROSTAT) 0.4 MG SL tablet Place 1 tablet (0.4 mg total) under the tongue every 5 (five) minutes as needed for chest pain  (up to 3 doses). 01/11/13   Dunn, Nedra Hai, PA-C  nitroGLYCERIN (NITROSTAT) 0.4 MG SL tablet Place 1 tablet (0.4 mg total) under the tongue as needed for chest pain 03/18/22     omega-3 acid ethyl esters (LOVAZA) 1 g capsule Take 2 capsules (2 g total) by mouth 2 (two) times daily. 03/18/22     omeprazole (PRILOSEC) 40 MG capsule Take 1 capsule (40 mg total) by mouth every morning. 03/18/22     pregabalin (LYRICA) 150 MG capsule Take 1 capsule (150 mg total) by mouth 2 (two) times daily. 12/18/21     rosuvastatin (CRESTOR) 40 MG tablet Take 1 tablet (40 mg total) by mouth at bedtime. 03/18/22     sildenafil (VIAGRA) 100 MG tablet Take 1 tablet (100 mg total) by mouth as needed, use 2-3 hours before sexual activity, max of 8-10 tablets in a month 03/18/22     tiZANidine (ZANAFLEX) 4 MG tablet Take 1 tablet (4 mg total) by mouth 2 (two) times daily as needed. 05/19/22         Allergies    Patient has no known allergies.    Review of Systems   Review of Systems  Physical Exam Updated Vital Signs BP 125/83   Pulse 76  Temp 97.9 F (36.6 C) (Oral)   Resp 15   Ht 6' (1.829 m)   Wt 96.6 kg   SpO2 94%   BMI 28.89 kg/m  Physical Exam Vitals and nursing note reviewed.  Constitutional:      General: He is not in acute distress.    Appearance: He is well-developed.  HENT:     Head: Normocephalic and atraumatic.     Nose: Nose normal.  Eyes:     Extraocular Movements: Extraocular movements intact.     Conjunctiva/sclera: Conjunctivae normal.     Pupils: Pupils are equal, round, and reactive to light.  Cardiovascular:     Rate and Rhythm: Normal rate and regular rhythm.     Pulses: Normal pulses.     Heart sounds: No murmur heard. Pulmonary:     Effort: Pulmonary effort is normal. No respiratory distress.     Breath sounds: Normal breath sounds.  Abdominal:     Palpations: Abdomen is soft.     Tenderness: There is no abdominal tenderness.  Musculoskeletal:        General: Tenderness present.  No swelling.     Cervical back: Neck supple.     Comments: Mild tenderness to the left pinky toe that has some skin breakdown and some mild erythema but there is no pus or drainage, there is no significant redness to the left foot otherwise and overall discoloration of skin just to the left pinky toe  Skin:    General: Skin is warm and dry.     Capillary Refill: Capillary refill takes less than 2 seconds.  Neurological:     General: No focal deficit present.     Mental Status: He is alert.     Sensory: No sensory deficit.     Motor: No weakness.  Psychiatric:        Mood and Affect: Mood normal.     ED Results / Procedures / Treatments   Labs (all labs ordered are listed, but only abnormal results are displayed) Labs Reviewed - No data to display  EKG None  Radiology No results found.  Procedures Procedures    Medications Ordered in ED Medications - No data to display  ED Course/ Medical Decision Making/ A&P                           Medical Decision Making Risk Prescription drug management.   [name] [name] is here with left pinky toe pain.  Normal vitals.  No fever.  Sent here for further evaluation by orthopedic.  Supposedly had x-ray that was negative for fracture.  I offered him another x-ray to confirm this as I am not able to see that x-ray.  He declined.  Clinically on exam may be has a mild cellulitis versus contusion of the left pinky toe.  He has normal strength and sensation and normal pulses in the left lower extremity.  He was told that he may need MRI to rule out osteomyelitis but clinically have very low suspicion for this.  He does have a history of gout but does not appear to be gout.  Shared decision was made to trial patient on antibiotics and have him follow-up with podiatry.  I do not have ability to do MRI here and overall does not seem patient wants an MRI.  Overall he appears to have issues solely with his left pinky toe.  There is no redness or  swelling that extends  beyond this area.  I think it is reasonable to keep him in postop shoe and have him start some antibiotics and follow-up with podiatry.  He understands to return if symptoms worsen.  Discharged in good condition.  This chart was dictated using voice recognition software.  Despite best efforts to proofread,  errors can occur which can change the documentation meaning.         Final Clinical Impression(s) / ED Diagnoses Final diagnoses:  Cellulitis, unspecified cellulitis site  Pain of toe of left foot    Rx / DC Orders ED Discharge Orders          Ordered    doxycycline (VIBRAMYCIN) 100 MG capsule  2 times daily        06/07/22 1647    oxyCODONE (ROXICODONE) 5 MG immediate release tablet  Every 6 hours PRN        06/07/22 Boyd, [place], DO 06/07/22 1652

## 2022-06-07 NOTE — ED Triage Notes (Signed)
Pt c/o pain to LT 5th toe x 1 wk; no injury, but was wearing work boots when he noticed it; was seen at [facility] and referred here for MRI; pt informed we will not be able to do MRI; EDP in to see pt in triage

## 2022-06-07 NOTE — ED Notes (Signed)
Pt visitor stated "we are going to [facility]" and handed this NT labels. This NT instructed pt to get arm band cut off by registration in the box.

## 2022-06-07 NOTE — Discharge Instructions (Addendum)
Overall suspect may be you have a infection of your left pinky toe.  Take antibiotic as prescribed.  Follow-up with podiatry.  Please return if symptoms worsen as discussed.

## 2022-06-23 DIAGNOSIS — S2231XG Fracture of one rib, right side, subsequent encounter for fracture with delayed healing: Secondary | ICD-10-CM | POA: Insufficient documentation

## 2022-06-24 ENCOUNTER — Other Ambulatory Visit (HOSPITAL_COMMUNITY): Payer: Self-pay

## 2022-06-24 DIAGNOSIS — L03116 Cellulitis of left lower limb: Secondary | ICD-10-CM | POA: Insufficient documentation

## 2022-06-24 MED ORDER — OXYCODONE HCL 5 MG PO TABS
5.0000 mg | ORAL_TABLET | Freq: Four times a day (QID) | ORAL | 0 refills | Status: DC
  Filled 2022-06-24: qty 20, 5d supply, fill #0

## 2022-06-24 MED ORDER — CEPHALEXIN 500 MG PO CAPS
500.0000 mg | ORAL_CAPSULE | Freq: Four times a day (QID) | ORAL | 0 refills | Status: DC
Start: 2022-06-24 — End: 2022-10-29
  Filled 2022-06-24: qty 56, 14d supply, fill #0

## 2022-06-25 ENCOUNTER — Other Ambulatory Visit (HOSPITAL_COMMUNITY): Payer: Self-pay | Admitting: Orthopaedic Surgery

## 2022-06-25 ENCOUNTER — Other Ambulatory Visit: Payer: Self-pay

## 2022-06-25 ENCOUNTER — Encounter (HOSPITAL_BASED_OUTPATIENT_CLINIC_OR_DEPARTMENT_OTHER): Payer: Self-pay | Admitting: Orthopaedic Surgery

## 2022-06-25 DIAGNOSIS — R52 Pain, unspecified: Secondary | ICD-10-CM

## 2022-06-25 NOTE — Progress Notes (Signed)
Patient's chart and hx of CAD reviewed with Dr Daiva Huge. OK for [facility] tomorrow, will do EKG DOS.

## 2022-06-25 NOTE — Progress Notes (Signed)
   06/25/22 1250  PAT Phone Screen  Is the patient taking a GLP-1 receptor agonist? No  Do You Have Diabetes? No  Do You Have Hypertension? Yes  Have You Ever Been to the ER for Asthma? No  Have You Taken Oral Steroids in the Past 3 Months? No  Do you Take Phenteramine or any Other Diet Drugs? No  Recent  Lab Work, EKG, CXR? No  Do you have a history of heart problems? (S)  Yes (NSTEMI 2014-PTCA RCA)  Cardiologist Name Saw Roque Cash PA at [facility] about 1 yr ago  Have you ever had tests on your heart? Yes  What cardiac tests were performed? Cardiac Cath  What date/year were cardiac tests completed? 2014 thru [facility]  Results viewable: (S)  Requested (with Megan at [facility] office)  Any Recent Hospitalizations? No  Height 6' (1.829 m)  Weight 97.1 kg  Pat Appointment Scheduled (S)  No (will need EKG DOS)

## 2022-06-26 ENCOUNTER — Other Ambulatory Visit: Payer: Self-pay

## 2022-06-26 ENCOUNTER — Encounter (HOSPITAL_BASED_OUTPATIENT_CLINIC_OR_DEPARTMENT_OTHER): Payer: Self-pay | Admitting: Orthopaedic Surgery

## 2022-06-26 ENCOUNTER — Ambulatory Visit (HOSPITAL_BASED_OUTPATIENT_CLINIC_OR_DEPARTMENT_OTHER)
Admission: RE | Admit: 2022-06-26 | Discharge: 2022-06-26 | Disposition: A | Discharge status: Home / Self Care | Payer: 59 | Source: Ambulatory Visit

## 2022-06-26 ENCOUNTER — Ambulatory Visit (HOSPITAL_BASED_OUTPATIENT_CLINIC_OR_DEPARTMENT_OTHER): Payer: 59 | Admitting: Certified Registered"

## 2022-06-26 ENCOUNTER — Ambulatory Visit (HOSPITAL_BASED_OUTPATIENT_CLINIC_OR_DEPARTMENT_OTHER)
Admission: RE | Admit: 2022-06-26 | Discharge: 2022-06-26 | Disposition: A | Discharge status: Home / Self Care | Payer: 59 | Attending: Orthopaedic Surgery | Admitting: Orthopaedic Surgery

## 2022-06-26 ENCOUNTER — Ambulatory Visit (HOSPITAL_BASED_OUTPATIENT_CLINIC_OR_DEPARTMENT_OTHER): Payer: 59

## 2022-06-26 ENCOUNTER — Other Ambulatory Visit (HOSPITAL_COMMUNITY): Payer: Self-pay

## 2022-06-26 ENCOUNTER — Encounter (HOSPITAL_BASED_OUTPATIENT_CLINIC_OR_DEPARTMENT_OTHER)
Admission: RE | Disposition: A | Discharge status: Home / Self Care | Payer: Self-pay | Source: Home / Self Care | Attending: Orthopaedic Surgery

## 2022-06-26 DIAGNOSIS — I96 Gangrene, not elsewhere classified: Secondary | ICD-10-CM | POA: Insufficient documentation

## 2022-06-26 DIAGNOSIS — F17210 Nicotine dependence, cigarettes, uncomplicated: Secondary | ICD-10-CM

## 2022-06-26 DIAGNOSIS — I252 Old myocardial infarction: Secondary | ICD-10-CM | POA: Insufficient documentation

## 2022-06-26 DIAGNOSIS — I2510 Atherosclerotic heart disease of native coronary artery without angina pectoris: Secondary | ICD-10-CM | POA: Diagnosis not present

## 2022-06-26 DIAGNOSIS — J449 Chronic obstructive pulmonary disease, unspecified: Secondary | ICD-10-CM | POA: Diagnosis not present

## 2022-06-26 DIAGNOSIS — I10 Essential (primary) hypertension: Secondary | ICD-10-CM | POA: Insufficient documentation

## 2022-06-26 DIAGNOSIS — L97529 Non-pressure chronic ulcer of other part of left foot with unspecified severity: Secondary | ICD-10-CM

## 2022-06-26 DIAGNOSIS — K219 Gastro-esophageal reflux disease without esophagitis: Secondary | ICD-10-CM | POA: Insufficient documentation

## 2022-06-26 DIAGNOSIS — R52 Pain, unspecified: Secondary | ICD-10-CM | POA: Insufficient documentation

## 2022-06-26 HISTORY — PX: AMPUTATION TOE: SHX6595

## 2022-06-26 HISTORY — DX: Gastro-esophageal reflux disease without esophagitis: K21.9

## 2022-06-26 HISTORY — DX: Other specified mononeuropathies: G58.8

## 2022-06-26 HISTORY — DX: Chronic pain syndrome: G89.4

## 2022-06-26 SURGERY — AMPUTATION, TOE
Anesthesia: General | Site: Toe | Laterality: Left

## 2022-06-26 MED ORDER — HYDROMORPHONE HCL 1 MG/ML IJ SOLN: 0.2500 mg | INTRAMUSCULAR | Status: DC | PRN

## 2022-06-26 MED ORDER — POVIDONE-IODINE 10 % EX SWAB: 2.0000 | Freq: Once | CUTANEOUS | Status: DC

## 2022-06-26 MED ORDER — VANCOMYCIN HCL IN DEXTROSE 1-5 GM/200ML-% IV SOLN
1000.0000 mg | INTRAVENOUS | Status: AC
  Administered 2022-06-26: 1000 mg via INTRAVENOUS

## 2022-06-26 MED ORDER — DOCUSATE SODIUM 100 MG PO CAPS
100.0000 mg | ORAL_CAPSULE | Freq: Two times a day (BID) | ORAL | 0 refills | Status: DC
  Filled 2022-06-26: qty 56, 28d supply, fill #0

## 2022-06-26 MED ORDER — ONDANSETRON HCL 4 MG/2ML IJ SOLN
INTRAMUSCULAR | Status: DC | PRN
  Administered 2022-06-26: 4 mg via INTRAVENOUS

## 2022-06-26 MED ORDER — DEXAMETHASONE SODIUM PHOSPHATE 10 MG/ML IJ SOLN
INTRAMUSCULAR | Status: AC
  Filled 2022-06-26: qty 1

## 2022-06-26 MED ORDER — CIPROFLOXACIN IN D5W 400 MG/200ML IV SOLN
400.0000 mg | Freq: Once | INTRAVENOUS | Status: AC
  Administered 2022-06-26: 400 mg via INTRAVENOUS

## 2022-06-26 MED ORDER — LIDOCAINE HCL (CARDIAC) PF 100 MG/5ML IV SOSY
PREFILLED_SYRINGE | INTRAVENOUS | Status: DC | PRN
  Administered 2022-06-26: 50 mg via INTRAVENOUS

## 2022-06-26 MED ORDER — LACTATED RINGERS IV SOLN: INTRAVENOUS | Status: DC

## 2022-06-26 MED ORDER — VANCOMYCIN HCL 500 MG IV SOLR
INTRAVENOUS | Status: AC
  Filled 2022-06-26: qty 10

## 2022-06-26 MED ORDER — CHLORHEXIDINE GLUCONATE 4 % EX LIQD: 60.0000 mL | Freq: Once | CUTANEOUS | Status: DC

## 2022-06-26 MED ORDER — ASPIRIN 81 MG PO TBEC
81.0000 mg | DELAYED_RELEASE_TABLET | Freq: Two times a day (BID) | ORAL | 0 refills | Status: DC
  Filled 2022-06-26: qty 56, 28d supply, fill #0

## 2022-06-26 MED ORDER — BUPIVACAINE-EPINEPHRINE (PF) 0.25% -1:200000 IJ SOLN
INTRAMUSCULAR | Status: AC
  Filled 2022-06-26: qty 30

## 2022-06-26 MED ORDER — LACTATED RINGERS IV SOLN: INTRAVENOUS | Status: DC | PRN

## 2022-06-26 MED ORDER — FENTANYL CITRATE (PF) 100 MCG/2ML IJ SOLN
INTRAMUSCULAR | Status: AC
  Filled 2022-06-26: qty 2

## 2022-06-26 MED ORDER — PROPOFOL 10 MG/ML IV BOLUS
INTRAVENOUS | Status: DC | PRN
  Administered 2022-06-26: 200 mg via INTRAVENOUS

## 2022-06-26 MED ORDER — PHENYLEPHRINE HCL (PRESSORS) 10 MG/ML IV SOLN
INTRAVENOUS | Status: DC | PRN
  Administered 2022-06-26: 240 ug via INTRAVENOUS
  Administered 2022-06-26 (×2): 80 ug via INTRAVENOUS

## 2022-06-26 MED ORDER — BUPIVACAINE HCL (PF) 0.25 % IJ SOLN
INTRAMUSCULAR | Status: DC | PRN
  Administered 2022-06-26: 10 mL

## 2022-06-26 MED ORDER — BUPIVACAINE HCL (PF) 0.25 % IJ SOLN
INTRAMUSCULAR | Status: AC
  Filled 2022-06-26: qty 30

## 2022-06-26 MED ORDER — ACETAMINOPHEN 500 MG PO TABS
1000.0000 mg | ORAL_TABLET | Freq: Once | ORAL | Status: AC
  Administered 2022-06-26: 1000 mg via ORAL

## 2022-06-26 MED ORDER — LIDOCAINE 2% (20 MG/ML) 5 ML SYRINGE
INTRAMUSCULAR | Status: AC
  Filled 2022-06-26: qty 5

## 2022-06-26 MED ORDER — MIDAZOLAM HCL 2 MG/2ML IJ SOLN
INTRAMUSCULAR | Status: DC | PRN
  Administered 2022-06-26: 2 mg via INTRAVENOUS

## 2022-06-26 MED ORDER — PROPOFOL 10 MG/ML IV BOLUS
INTRAVENOUS | Status: AC
  Filled 2022-06-26: qty 20

## 2022-06-26 MED ORDER — VANCOMYCIN HCL 500 MG IV SOLR
INTRAVENOUS | Status: DC | PRN
  Administered 2022-06-26: 500 mg

## 2022-06-26 MED ORDER — FENTANYL CITRATE (PF) 100 MCG/2ML IJ SOLN
INTRAMUSCULAR | Status: DC | PRN
  Administered 2022-06-26: 50 ug via INTRAVENOUS

## 2022-06-26 MED ORDER — ONDANSETRON HCL 4 MG/2ML IJ SOLN
INTRAMUSCULAR | Status: AC
  Filled 2022-06-26: qty 2

## 2022-06-26 MED ORDER — MIDAZOLAM HCL 2 MG/2ML IJ SOLN
INTRAMUSCULAR | Status: AC
  Filled 2022-06-26: qty 2

## 2022-06-26 MED ORDER — BUPIVACAINE-EPINEPHRINE (PF) 0.5% -1:200000 IJ SOLN
INTRAMUSCULAR | Status: AC
  Filled 2022-06-26: qty 30

## 2022-06-26 MED ORDER — 0.9 % SODIUM CHLORIDE (POUR BTL) OPTIME
TOPICAL | Status: DC | PRN
  Administered 2022-06-26: 1000 mL

## 2022-06-26 MED ORDER — ACETAMINOPHEN 500 MG PO TABS
ORAL_TABLET | ORAL | Status: AC
  Filled 2022-06-26: qty 2

## 2022-06-26 MED ORDER — ONDANSETRON HCL 4 MG PO TABS
4.0000 mg | ORAL_TABLET | Freq: Two times a day (BID) | ORAL | 0 refills | Status: DC | PRN
  Filled 2022-06-26: qty 14, 7d supply, fill #0

## 2022-06-26 MED ORDER — OXYCODONE HCL 5 MG PO TABS
5.0000 mg | ORAL_TABLET | Freq: Four times a day (QID) | ORAL | 0 refills | Status: DC
  Filled 2022-06-26: qty 28, 7d supply, fill #0
  Filled 2022-06-27: qty 8, 2d supply, fill #0
  Filled 2022-06-27: qty 28, 7d supply, fill #0

## 2022-06-26 SURGICAL SUPPLY — 64 items
APL PRP STRL LF DISP 70% ISPRP (MISCELLANEOUS) ×2
BANDAGE ESMARK 6X9 LF (GAUZE/BANDAGES/DRESSINGS) ×1 IMPLANT
BLADE AVERAGE 25X9 (BLADE) IMPLANT
BLADE SURG 15 STRL LF DISP TIS (BLADE) ×4 IMPLANT
BLADE SURG 15 STRL SS (BLADE) ×4
BNDG CMPR 5X4 CHSV STRCH STRL (GAUZE/BANDAGES/DRESSINGS) ×1
BNDG CMPR 9X6 STRL LF SNTH (GAUZE/BANDAGES/DRESSINGS) ×1
BNDG COHESIVE 4X5 TAN STRL LF (GAUZE/BANDAGES/DRESSINGS) ×1 IMPLANT
BNDG ELASTIC 4X5.8 VLCR STR LF (GAUZE/BANDAGES/DRESSINGS) ×1 IMPLANT
BNDG ESMARK 6X9 LF (GAUZE/BANDAGES/DRESSINGS) ×1
BRUSH SCRUB EZ  4% CHG (MISCELLANEOUS)
BRUSH SCRUB EZ 4% CHG (MISCELLANEOUS) ×1 IMPLANT
CANISTER SUCT 1200ML W/VALVE (MISCELLANEOUS) ×1 IMPLANT
CHLORAPREP W/TINT 26 (MISCELLANEOUS) ×2 IMPLANT
COVER BACK TABLE 60X90IN (DRAPES) ×1 IMPLANT
CUFF TOURN SGL QUICK 34 (TOURNIQUET CUFF)
CUFF TRNQT CYL 34X4.125X (TOURNIQUET CUFF) IMPLANT
DRAPE C-ARMOR (DRAPES) IMPLANT
DRAPE EXTREMITY T 121X128X90 (DISPOSABLE) ×1 IMPLANT
DRAPE IMP U-DRAPE 54X76 (DRAPES) ×1 IMPLANT
DRAPE U-SHAPE 47X51 STRL (DRAPES) ×1 IMPLANT
DRSG EMULSION OIL 3X3 NADH (GAUZE/BANDAGES/DRESSINGS) IMPLANT
ELECT REM PT RETURN 9FT ADLT (ELECTROSURGICAL) ×1
ELECTRODE REM PT RTRN 9FT ADLT (ELECTROSURGICAL) ×1 IMPLANT
GAUZE PAD ABD 8X10 STRL (GAUZE/BANDAGES/DRESSINGS) ×1 IMPLANT
GAUZE SPONGE 4X4 12PLY STRL (GAUZE/BANDAGES/DRESSINGS) ×1 IMPLANT
GAUZE XEROFORM 1X8 LF (GAUZE/BANDAGES/DRESSINGS) ×1 IMPLANT
GLOVE BIOGEL PI IND STRL 8 (GLOVE) ×1 IMPLANT
GLOVE SURG SS PI 7.5 STRL IVOR (GLOVE) ×2 IMPLANT
GOWN STRL REUS W/ TWL LRG LVL3 (GOWN DISPOSABLE) ×2 IMPLANT
GOWN STRL REUS W/TWL LRG LVL3 (GOWN DISPOSABLE) ×2
MARKER SKIN DUAL TIP RULER LAB (MISCELLANEOUS) IMPLANT
NEEDLE HYPO 22GX1.5 SAFETY (NEEDLE) IMPLANT
NS IRRIG 1000ML POUR BTL (IV SOLUTION) ×1 IMPLANT
PACK BASIN DAY SURGERY FS (CUSTOM PROCEDURE TRAY) ×1 IMPLANT
PAD CAST 4YDX4 CTTN HI CHSV (CAST SUPPLIES) ×1 IMPLANT
PADDING CAST ABS COTTON 4X4 ST (CAST SUPPLIES) IMPLANT
PADDING CAST COTTON 4X4 STRL (CAST SUPPLIES)
PADDING CAST SYNTHETIC 4X4 STR (CAST SUPPLIES) ×4 IMPLANT
PENCIL SMOKE EVACUATOR (MISCELLANEOUS) ×1 IMPLANT
SANITIZER HAND PURELL FF 515ML (MISCELLANEOUS) ×1 IMPLANT
SET IRRIG Y TYPE TUR BLADDER L (SET/KITS/TRAYS/PACK) IMPLANT
SHEET MEDIUM DRAPE 40X70 STRL (DRAPES) ×1 IMPLANT
SLEEVE SCD COMPRESS KNEE MED (STOCKING) ×1 IMPLANT
SPIKE FLUID TRANSFER (MISCELLANEOUS) IMPLANT
SPONGE T-LAP 18X18 ~~LOC~~+RFID (SPONGE) ×1 IMPLANT
STOCKINETTE 6  STRL (DRAPES) ×1
STOCKINETTE 6 STRL (DRAPES) ×1 IMPLANT
STOCKINETTE ORTHO 6X25 (MISCELLANEOUS) ×1 IMPLANT
SUCTION FRAZIER HANDLE 10FR (MISCELLANEOUS) ×1
SUCTION TUBE FRAZIER 10FR DISP (MISCELLANEOUS) IMPLANT
SUT ETHILON 2 0 FS 18 (SUTURE) ×2 IMPLANT
SUT MNCRL AB 3-0 PS2 18 (SUTURE) ×1 IMPLANT
SUT PROLENE 2 0 CT2 30 (SUTURE) IMPLANT
SUT VIC AB 2-0 SH 27 (SUTURE) ×1
SUT VIC AB 2-0 SH 27XBRD (SUTURE) ×1 IMPLANT
SUT VIC AB 3-0 SH 27 (SUTURE)
SUT VIC AB 3-0 SH 27X BRD (SUTURE) IMPLANT
SUT VICRYL 0 SH 27 (SUTURE) IMPLANT
SYR BULB EAR ULCER 3OZ GRN STR (SYRINGE) ×1 IMPLANT
SYR CONTROL 10ML LL (SYRINGE) IMPLANT
TOWEL GREEN STERILE FF (TOWEL DISPOSABLE) ×2 IMPLANT
TUBE CONNECTING 20X1/4 (TUBING) ×1 IMPLANT
UNDERPAD 30X36 HEAVY ABSORB (UNDERPADS AND DIAPERS) ×1 IMPLANT

## 2022-06-26 NOTE — Transfer of Care (Signed)
Immediate Anesthesia Transfer of Care Note  Patient: [name] [name]  Procedure(s) Performed: AMPUTATION TOE 5th toe (Left: Toe)  Patient Location: PACU  Anesthesia Type:General  Level of Consciousness: awake, drowsy, and patient cooperative  Airway & Oxygen Therapy: Patient Spontanous Breathing and Patient connected to face mask oxygen  Post-op Assessment: Report given to RN and Post -op Vital signs reviewed and stable  Post vital signs: Reviewed and stable  Last Vitals:  Vitals Value Taken Time  BP    Temp    Pulse    Resp    SpO2      Last Pain:  Vitals:   06/26/22 0929  TempSrc: Oral  PainSc: 5       Patients Stated Pain Goal: 5 (74/09/92 7800)  Complications: No notable events documented.

## 2022-06-26 NOTE — Anesthesia Postprocedure Evaluation (Signed)
Anesthesia Post Note  Patient: [name] [name]  Procedure(s) Performed: AMPUTATION TOE 5th toe (Left: Toe)     Patient location during evaluation: PACU Anesthesia Type: General Level of consciousness: awake and alert Pain management: pain level controlled Vital Signs Assessment: post-procedure vital signs reviewed and stable Respiratory status: spontaneous breathing, nonlabored ventilation and respiratory function stable Cardiovascular status: blood pressure returned to baseline and stable Postop Assessment: no apparent nausea or vomiting Anesthetic complications: no  No notable events documented.  Last Vitals:  Vitals:   06/26/22 1300 06/26/22 1306  BP: 124/81 107/81  Pulse: 70 69  Resp: 10 15  Temp:    SpO2: 94% 94%    Last Pain:  Vitals:   06/26/22 1300  TempSrc:   PainSc: 0-No pain    LLE Motor Response: Purposeful movement (06/26/22 1300) LLE Sensation: Full sensation (06/26/22 1300)          Dezyre Hoefer,W. EDMOND

## 2022-06-26 NOTE — H&P (Signed)
ORTHOPAEDIC SURGERY H&P  Subjective:  The patient presents with left fifth toe gangrene x 4 wk no improvement on abx.   Past Medical History:  Diagnosis Date   Anxiety    Chronic pain disorder    COPD (chronic obstructive pulmonary disease) (HCC)    smoker   Coronary artery disease    a. NSTEMI 01/2013 s/p PTCA to prox RCA occlusion.   GERD (gastroesophageal reflux disease)    Gout    Headache(784.0)    HLD (hyperlipidemia)    Hypertension    Intercostal neuralgia    multiple rib fractures 2022   Screening for chemical poisoning and contamination    Tobacco abuse     Past Surgical History:  Procedure Laterality Date   CARDIAC CATHETERIZATION     ELBOW SURGERY     from [place]   KNEE SURGERY  2009   left knee orthroscopy   LEFT HEART CATHETERIZATION WITH CORONARY ANGIOGRAM N/A 01/10/2013   Procedure: LEFT HEART CATHETERIZATION WITH CORONARY ANGIOGRAM;  Surgeon: Thayer Headings, MD;  Location: [facility] CATH LAB;  Service: Cardiovascular;  Laterality: N/A;   RIB PLATING Right 09/17/2020   Procedure: RIB PLATING 5-9;  Surgeon: Melrose Nakayama, MD;  Location: [facility] OR;  Service: Thoracic;  Laterality: Right;     (Not in an outpatient encounter)    No Known Allergies  Social History   Socioeconomic History   Marital status: Married    Spouse name: Not on file   Number of children: Not on file   Years of education: Not on file   Highest education level: Not on file  Occupational History   Not on file  Tobacco Use   Smoking status: Every Day    Packs/day: 1.00    Years: 15.00    Total pack years: 15.00    Types: Cigarettes    Last attempt to quit: 01/09/2013    Years since quitting: 9.4   Smokeless tobacco: Never  Vaping Use   Vaping Use: Never used  Substance and Sexual Activity   Alcohol use: Yes    Comment: occ   Drug use: No   Sexual activity: Yes  Other Topics Concern   Not on file  Social History Narrative   Not on file   Social Determinants of Health    Financial Resource Strain: Not on file  Food Insecurity: Not on file  Transportation Needs: Not on file  Physical Activity: Not on file  Stress: Not on file  Social Connections: Not on file  Intimate Partner Violence: Not on file     History reviewed. No pertinent family history.   Review of Systems Pertinent items are noted in HPI.  Objective: Vital signs in last 24 hours:    06/26/2022    9:29 AM 06/25/2022   12:50 PM 06/07/2022    4:45 PM  Vitals with BMI  Height '6\' 0"'$  '6\' 0"'$  '6\' 0"'$   Weight 209 lbs 11 oz 214 lbs 213 lbs  BMI 28.43 03.15 94.58  Systolic 592    Diastolic 72    Pulse 74        EXAM: General: Well nourished, well developed. Awake, alert and oriented to time, place, person. Normal mood and affect. No apparent distress. Breathing room air.  Operative Lower Extremity: Alignment - Neutral Deformity - None Skin intact Tenderness to palpation - left fifth toe full thickness ulcerations with erythema Normal range of motion 5/5 TA, PT, GS, Per, EHL, FHL Sensation intact to light touch throughout  Palpable DP and PT pulses Special testing: None  The contralateral foot/ankle was examined for comparison and noted to be neurovascularly intact with no localized deformity, swelling, or tenderness.  Imaging Review All images taken were independently reviewed by me.  Assessment/Plan: The clinical and radiographic findings were reviewed and discussed at length with the patient.  The patient has left fifth toe gangrene x 4 wk no improvement on abx.  We spoke at length about the natural course of these findings. We discussed nonoperative and operative treatment options in detail.  The risks and benefits were presented and reviewed. The risks due to recurrent/persistent/new infection, stiffness, nerve/vessel/tendon injury, wound healing issues, failure of this surgery, need for further surgery, thromboembolic events, amputation, death among others were  discussed. The patient acknowledged the explanation and agreed to proceed with the plan.  Armond Hang  Orthopaedic Surgery [facility]

## 2022-06-26 NOTE — Op Note (Signed)
06/26/2022  2:09 PM   PATIENT: [name] [name]  56 y.o. male  MRN: [id number]   PRE-OPERATIVE DIAGNOSIS:   Left fifth toe gangrene with acute full thickness ulcers   POST-OPERATIVE DIAGNOSIS:   Same   PROCEDURE: Left fifth toe partial amputation   SURGEON:  Armond Hang, MD   ASSISTANT: None   ANESTHESIA: General, regional   EBL: Minimal   TOURNIQUET:  ~22 min   COMPLICATIONS: None apparent   DISPOSITION: Extubated, awake and stable to recovery.   INDICATION FOR PROCEDURE: The patient presented with left fifth toe gangrene with new ulcerations and purulent drainage x 4 wk in setting of no known trauma. He underwent ABIs this morning which were abnormal. His PMH notable for CAD (prior MI), PAD, current smoker (1 ppd). He had previously been on antibiotics without improvement and now new ulcerations.   We discussed the diagnosis, alternative treatment options, risks and benefits of the above surgical intervention, as well as alternative non-operative treatments. All questions/concerns were addressed and the patient/family demonstrated appropriate understanding of the diagnosis, the procedure, the postoperative course, and overall prognosis. The patient wished to proceed with surgical intervention and signed an informed surgical consent as such, in each others presence prior to surgery.   PROCEDURE IN DETAIL: After preoperative consent was obtained and the correct operative site was identified, the patient was brought to the operating room supine on stretcher and transferred onto operating table. General anesthesia was induced. Preoperative antibiotics were administered. Surgical timeout was taken. The patient was then positioned supine with an ipsilateral hip bump. The operative lower extremity was prepped and draped in standard sterile fashion with a tourniquet around the thigh. The extremity was exsanguinated with a calf tourniquet.  A standard fishmouth incision  was made over the fifth toe at the PIP joint level proximal to the level of the ulcers. The fifth toe was disarticulated at the PIP joint and sent for pathology and microbiology analysis. A bone biopsy was taken from the middle phalanx and sent separately for cultures.  The surgical sites were thoroughly irrigated with saline and betadine. The tourniquet was deflated and hemostasis achieved. Vancomycin powder was applied. The skin was closed without tension using 2-0 prolene suture. Local anesthetic (0.25% plain marcaine without epi) was infiltrated.   The leg was cleaned with saline and sterile adaptic dressings with gauze were applied. The patient was awakened from anesthesia and transported to the recovery room in stable condition.    FOLLOW UP PLAN: -transfer to PACU, then home -strict heel weightbearing operative extremity, maximum elevation -Keflex rx -DVT ppx: Aspirin 81 mg twice daily x 4 wk -referral to Vascular Surgery for abnormal ABIs and possible ischemic etiology of left fifth toe gangrene -trend intraoperative cultures -follow up intraoperative pathology -follow up as outpatient on Tues 11/21 for wound check -sutures out in 3 weeks in outpatient office   RADIOGRAPHS: AP, lateral, oblique radiographs of the left foot were obtained intraoperatively. These showed interval partial fifth toe amputation. No other acute injuries are noted.   Armond Hang Orthopaedic Surgery [facility]

## 2022-06-26 NOTE — Anesthesia Preprocedure Evaluation (Addendum)
Anesthesia Evaluation  Patient identified by MRN, date of birth, ID band Patient awake    Reviewed: Allergy & Precautions, H&P , NPO status , Patient's Chart, lab work & pertinent test results, reviewed documented beta blocker date and time   Airway Mallampati: III  TM Distance: >3 FB Neck ROM: Full    Dental no notable dental hx. (+) Teeth Intact, Dental Advisory Given   Pulmonary COPD, Current Smoker and Patient abstained from smoking.   Pulmonary exam normal breath sounds clear to auscultation       Cardiovascular hypertension, Pt. on medications and Pt. on home beta blockers + CAD   Rhythm:Regular Rate:Normal     Neuro/Psych  Headaches  Anxiety        GI/Hepatic Neg liver ROS,GERD  Medicated,,  Endo/Other  negative endocrine ROS    Renal/GU negative Renal ROS  negative genitourinary   Musculoskeletal  (+) Arthritis , Osteoarthritis,    Abdominal   Peds  Hematology negative hematology ROS (+)   Anesthesia Other Findings   Reproductive/Obstetrics negative OB ROS                             Anesthesia Physical Anesthesia Plan  ASA: 3  Anesthesia Plan: General   Post-op Pain Management: Tylenol PO (pre-op)* and Toradol IV (intra-op)*   Induction: Intravenous  PONV Risk Score and Plan: 2 and Ondansetron, Dexamethasone and Midazolam  Airway Management Planned: LMA  Additional Equipment:   Intra-op Plan:   Post-operative Plan: Extubation in OR  Informed Consent: I have reviewed the patients History and Physical, chart, labs and discussed the procedure including the risks, benefits and alternatives for the proposed anesthesia with the patient or authorized representative who has indicated his/her understanding and acceptance.     Dental advisory given  Plan Discussed with: CRNA  Anesthesia Plan Comments:        Anesthesia Quick Evaluation

## 2022-06-26 NOTE — Progress Notes (Signed)
VASCULAR LAB    ABI has been performed.  See CV proc for preliminary results.   Luian Schumpert, RVT 06/26/2022, 8:31 AM

## 2022-06-26 NOTE — Discharge Instructions (Addendum)
  Post Anesthesia Home Care Instructions  Activity: Get plenty of rest for the remainder of the day. A responsible individual must stay with you for 24 hours following the procedure.  For the next 24 hours, DO NOT: -Drive a car -Paediatric nurse -Drink alcoholic beverages -Take any medication unless instructed by your physician -Make any legal decisions or sign important papers.  Meals: Start with liquid foods such as gelatin or soup. Progress to regular foods as tolerated. Avoid greasy, spicy, heavy foods. If nausea and/or vomiting occur, drink only clear liquids until the nausea and/or vomiting subsides. Call your physician if vomiting continues.  Special Instructions/Symptoms: Your throat may feel dry or sore from the anesthesia or the breathing tube placed in your throat during surgery. If this causes discomfort, gargle with warm salt water. The discomfort should disappear within 24 hours.  If you had a scopolamine patch placed behind your ear for the management of post- operative nausea and/or vomiting:  1. The medication in the patch is effective for 72 hours, after which it should be removed.  Wrap patch in a tissue and discard in the trash. Wash hands thoroughly with soap and water. 2. You may remove the patch earlier than 72 hours if you experience unpleasant side effects which may include dry mouth, dizziness or visual disturbances. 3. Avoid touching the patch. Wash your hands with soap and water after contact with the patch.     May have Tylenol after 3:47 PM today  Heel weight bearing only in post operative shoe.  Dressing may come off in 48hrs. Wife to change dressing as discussed with Dr. Kathaleen Bury.  Continue antibiotics at home.  Follow up 07/01/22 at 8am.

## 2022-06-26 NOTE — H&P (Signed)
H&P Update:  -History and Physical Reviewed  -Patient has been re-examined  -No change in the plan of care  -The risks and benefits were presented and reviewed. The risks due to recurrent/new/persistent infection, stiffness, nerve/vessel/tendon injury, nonunion/malunion, wound healing issues, development of arthritis, failure of this surgery, possibility of delayed definitive surgery, need for further surgery, thromboembolic events, anesthesia/medical complications, amputation, death among others were discussed. The patient acknowledged the explanation, agreed to proceed with the plan and a consent was signed.  Armond Hang

## 2022-06-27 ENCOUNTER — Other Ambulatory Visit (HOSPITAL_COMMUNITY): Payer: Self-pay

## 2022-06-30 ENCOUNTER — Encounter (HOSPITAL_BASED_OUTPATIENT_CLINIC_OR_DEPARTMENT_OTHER): Payer: Self-pay | Admitting: Orthopaedic Surgery

## 2022-06-30 LAB — SURGICAL PATHOLOGY

## 2022-07-01 LAB — AEROBIC/ANAEROBIC CULTURE W GRAM STAIN (SURGICAL/DEEP WOUND)
Culture: NO GROWTH
Culture: NO GROWTH
Gram Stain: NONE SEEN
Gram Stain: NONE SEEN

## 2022-07-15 ENCOUNTER — Other Ambulatory Visit (HOSPITAL_COMMUNITY): Payer: Self-pay

## 2022-08-05 ENCOUNTER — Other Ambulatory Visit (HOSPITAL_COMMUNITY): Payer: Self-pay

## 2022-08-05 MED ORDER — ALPRAZOLAM 1 MG PO TABS
1.0000 mg | ORAL_TABLET | Freq: Four times a day (QID) | ORAL | 1 refills | Status: DC | PRN
  Filled 2022-08-05: qty 120, 30d supply, fill #0
  Filled 2022-09-17: qty 120, 30d supply, fill #1

## 2022-08-06 ENCOUNTER — Other Ambulatory Visit (HOSPITAL_COMMUNITY): Payer: Self-pay

## 2022-08-22 ENCOUNTER — Encounter: Payer: 59 | Admitting: Surgery

## 2022-09-02 ENCOUNTER — Ambulatory Visit: Payer: BC Managed Care – PPO | Admitting: Vascular Surgery

## 2022-09-02 ENCOUNTER — Other Ambulatory Visit (HOSPITAL_COMMUNITY): Payer: Self-pay

## 2022-09-02 ENCOUNTER — Encounter: Payer: Self-pay | Admitting: Vascular Surgery

## 2022-09-02 VITALS — BP 130/75 | HR 70 | Temp 98.2°F | Resp 16 | Ht 72.0 in | Wt 216.0 lb

## 2022-09-02 DIAGNOSIS — I96 Gangrene, not elsewhere classified: Secondary | ICD-10-CM | POA: Diagnosis not present

## 2022-09-02 DIAGNOSIS — I739 Peripheral vascular disease, unspecified: Secondary | ICD-10-CM | POA: Diagnosis not present

## 2022-09-02 NOTE — Progress Notes (Signed)
Patient name: [name] [name] MRN: [id number] DOB: 1966/05/08 Sex: male  REASON FOR CONSULT: Evaluate vascular insufficiency of bilateral feet   HPI: [name] is a 57 y.o. male, with history of COPD, coronary disease, hypertension, hyperlipidemia, tobacco abuse that presents for evaluation of arterial insufficiency of his bilateral lower extremities.  Patient describes cramping in both calves particularly worse in the right leg.  Patient previously had left fifth toe gangrene requiring a partial toe amputation on 06/26/2022 by Ortho.  This was related to a cat bite.  He had ABIs on 06/26/2022 that were 0.65 on the right and 0.9 on the left.  States he has been a lifetime smoker.  Putting betadine paint on the left 5th toe.  Past Medical History:  Diagnosis Date   Anxiety    Chronic pain disorder    COPD (chronic obstructive pulmonary disease) (HCC)    smoker   Coronary artery disease    a. NSTEMI 01/2013 s/p PTCA to prox RCA occlusion.   GERD (gastroesophageal reflux disease)    Gout    Headache(784.0)    HLD (hyperlipidemia)    Hypertension    Intercostal neuralgia    multiple rib fractures 2022   Screening for chemical poisoning and contamination    Tobacco abuse     Past Surgical History:  Procedure Laterality Date   AMPUTATION TOE Left 06/26/2022   Procedure: AMPUTATION TOE 5th toe;  Surgeon: Armond Hang, MD;  Location: [place];  Service: Orthopedics;  Laterality: Left;  choice   CARDIAC CATHETERIZATION     ELBOW SURGERY     from [place]   KNEE SURGERY  2009   left knee orthroscopy   LEFT HEART CATHETERIZATION WITH CORONARY ANGIOGRAM N/A 01/10/2013   Procedure: LEFT HEART CATHETERIZATION WITH CORONARY ANGIOGRAM;  Surgeon: Thayer Headings, MD;  Location: [facility] CATH LAB;  Service: Cardiovascular;  Laterality: N/A;   RIB PLATING Right 09/17/2020   Procedure: RIB PLATING 5-9;  Surgeon: Melrose Nakayama, MD;  Location: [facility] OR;  Service: Thoracic;   Laterality: Right;    Family History  Problem Relation Age of Onset   CAD Brother 61   Stroke Father    COPD Sister    Drug abuse Brother    Healthy Son    Healthy Son    Healthy Daughter     SOCIAL HISTORY: Social History   Socioeconomic History   Marital status: Married    Spouse name: Not on file   Number of children: Not on file   Years of education: Not on file   Highest education level: Not on file  Occupational History   Not on file  Tobacco Use   Smoking status: Every Day    Packs/day: 1.00    Years: 15.00    Total pack years: 15.00    Types: Cigarettes    Last attempt to quit: 01/09/2013    Years since quitting: 9.6   Smokeless tobacco: Never  Vaping Use   Vaping Use: Never used  Substance and Sexual Activity   Alcohol use: Yes    Comment: occ   Drug use: No   Sexual activity: Yes  Other Topics Concern   Not on file  Social History Narrative   Not on file   Social Determinants of Health   Financial Resource Strain: Not on file  Food Insecurity: Not on file  Transportation Needs: Not on file  Physical Activity: Not on file  Stress: Not on file  Social  Connections: Not on file  Intimate Partner Violence: Not on file    No Known Allergies  Current Outpatient Medications  Medication Sig Dispense Refill   allopurinol (ZYLOPRIM) 100 MG tablet Take 1 tablet (100 mg total) by mouth daily. 90 tablet 0   ALPRAZolam (XANAX) 1 MG tablet Take 1 tablet (1 mg total) by mouth 4 (four) times daily as needed. 120 tablet 1   aspirin EC (ASPIRIN 81) 81 MG tablet Take 1 tablet (81 mg total) by mouth daily with food 90 tablet 0   aspirin EC (ASPIRIN LOW DOSE) 81 MG tablet Take 1 tablet (81 mg total) by mouth 2 (two) times daily for 28 days. 56 tablet 0   cephALEXin (KEFLEX) 500 MG capsule Take 1 capsule (500 mg total) by mouth every 6 (six) hours for 14 days 56 capsule 0   docusate sodium (COLACE) 100 MG capsule Take 1 capsule (100 mg total) by mouth 2 (two) times  daily for 28 days. 56 capsule 0   metoprolol succinate (TOPROL-XL) 50 MG 24 hr tablet Take 1 tablet (50 mg total) by mouth daily. 90 tablet 0   nitroGLYCERIN (NITROSTAT) 0.4 MG SL tablet Place 1 tablet (0.4 mg total) under the tongue every 5 (five) minutes as needed for chest pain (up to 3 doses). 25 tablet 4   nitroGLYCERIN (NITROSTAT) 0.4 MG SL tablet Place 1 tablet (0.4 mg total) under the tongue as needed for chest pain 25 tablet 1   omega-3 acid ethyl esters (LOVAZA) 1 g capsule Take 2 capsules (2 g total) by mouth 2 (two) times daily. 360 capsule 0   omeprazole (PRILOSEC) 40 MG capsule Take 1 capsule (40 mg total) by mouth every morning. 30 capsule 0   ondansetron (ZOFRAN) 4 MG tablet Take 1 tablet (4 mg total) by mouth 2 (two) times daily as needed for 7 days. 14 tablet 0   oxyCODONE (OXY IR/ROXICODONE) 5 MG immediate release tablet Take 1 tablet (5 mg total) by mouth every 6-8 hours for 7 days 28 tablet 0   oxyCODONE (OXY IR/ROXICODONE) 5 MG immediate release tablet Take 1 tablet (5 mg total) by mouth every 6-8 hours for 7 days 28 tablet 0   oxyCODONE (ROXICODONE) 5 MG immediate release tablet Take 1 tablet (5 mg total) by mouth every 6 (six) hours as needed for up to 10 doses for severe pain or breakthrough pain. 10 tablet 0   pregabalin (LYRICA) 150 MG capsule Take 1 capsule (150 mg total) by mouth 2 (two) times daily. 60 capsule 1   rosuvastatin (CRESTOR) 40 MG tablet Take 1 tablet (40 mg total) by mouth at bedtime. 90 tablet 0   sildenafil (VIAGRA) 100 MG tablet Take 1 tablet (100 mg total) by mouth as needed, use 2-3 hours before sexual activity, max of 8-10 tablets in a month 30 tablet 0   tiZANidine (ZANAFLEX) 4 MG tablet Take 1 tablet (4 mg total) by mouth 2 (two) times daily as needed. 60 tablet 1   Turmeric 500 MG CAPS Take by mouth.     No current facility-administered medications for this visit.    REVIEW OF SYSTEMS:  '[X]'$  denotes positive finding, '[ ]'$  denotes negative  finding Cardiac  Comments:  Chest pain or chest pressure:    Shortness of breath upon exertion:    Short of breath when lying flat:    Irregular heart rhythm:        Vascular    Pain in calf, thigh, or hip  brought on by ambulation: x Worse in right leg  Pain in feet at night that wakes you up from your sleep:     Blood clot in your veins:    Leg swelling:         Pulmonary    Oxygen at home:    Productive cough:     Wheezing:         Neurologic    Sudden weakness in arms or legs:     Sudden numbness in arms or legs:     Sudden onset of difficulty speaking or slurred speech:    Temporary loss of vision in one eye:     Problems with dizziness:         Gastrointestinal    Blood in stool:     Vomited blood:         Genitourinary    Burning when urinating:     Blood in urine:        Psychiatric    Major depression:         Hematologic    Bleeding problems:    Problems with blood clotting too easily:        Skin    Rashes or ulcers:        Constitutional    Fever or chills:      PHYSICAL EXAM: There were no vitals filed for this visit.  GENERAL: The patient is a well-nourished male, in no acute distress. The vital signs are documented above. CARDIAC: There is a regular rate and rhythm.  VASCULAR:  Bilateral femoral pulses palpable No palpable right pedal pulses Weakly palpable left PT Left fifth toe amputation with eschar as pictured. PULMONARY: No respiratory distress. ABDOMEN: Soft and non-tender. MUSCULOSKELETAL: There are no major deformities or cyanosis. NEUROLOGIC: No focal weakness or paresthesias are detected. PSYCHIATRIC: The patient has a normal affect.    DATA:   ABIs on 06/26/2022 are 0.65 on the right and 0.9 on the left  Assessment/Plan:  57 y.o. male, with history of COPD, coronary disease, hypertension, hyperlipidemia, tobacco abuse that presents for evaluation of arterial insufficiency of his bilateral lower extremities.  He endorses  bilateral calf claudication worse in the right leg consistent with PAD.  His ABIs are more depressed on the right at 0.65.  However, I discussed my bigger concern is his left fifth toe amputation that appears to have a nonhealing eschar.  Although his ABI on the left is higher at 0.9 he has marginal toe pressure of 65 and discussed this could lead to delayed wound healing.  I have recommended aortogram lower extremity arteriogram with initial focus on the left leg through a right transfemoral approach.  Discussed possible endovascular intervention.  Will also shoot right lower extremity runoff as well but discussed claudication is typically managed with lifestyle modification.  Risk benefits discussed.  Will get scheduled next week.  Discussed importance of smoking cessation.   Marty Heck, MD [facility] Office: [phone]

## 2022-09-03 ENCOUNTER — Telehealth: Payer: Self-pay

## 2022-09-03 ENCOUNTER — Other Ambulatory Visit: Payer: Self-pay

## 2022-09-03 DIAGNOSIS — I96 Gangrene, not elsewhere classified: Secondary | ICD-10-CM

## 2022-09-03 NOTE — Telephone Encounter (Signed)
Attempted to reach patient to schedule aortogram. Left message for patient to return call.

## 2022-09-03 NOTE — Telephone Encounter (Signed)
Spoke with patient's wife. Procedure scheduled for 09/11/22. Instructions provided- she verbalized understanding.

## 2022-09-09 NOTE — Telephone Encounter (Signed)
Patient's wife called requesting to postpone angiogram to the following week because something came up. Procedure rescheduled to 09/18/22. Instructions reviewed and she verbalized understanding.

## 2022-09-16 NOTE — Telephone Encounter (Signed)
Received a call from patient's wife requesting to cancel aortogram for 09/18/22 and will callback to reschedule when ready.

## 2022-09-17 ENCOUNTER — Other Ambulatory Visit (HOSPITAL_COMMUNITY): Payer: Self-pay

## 2022-09-18 ENCOUNTER — Encounter (HOSPITAL_COMMUNITY): Admission: RE | Payer: Self-pay | Source: Home / Self Care

## 2022-09-18 ENCOUNTER — Ambulatory Visit (HOSPITAL_COMMUNITY)
Admission: RE | Admit: 2022-09-18 | Payer: BC Managed Care – PPO | Source: Home / Self Care | Admitting: Vascular Surgery

## 2022-09-18 SURGERY — ABDOMINAL AORTOGRAM W/LOWER EXTREMITY
Anesthesia: LOCAL

## 2022-10-17 ENCOUNTER — Telehealth: Payer: Self-pay

## 2022-10-17 ENCOUNTER — Other Ambulatory Visit: Payer: Self-pay

## 2022-10-17 DIAGNOSIS — I739 Peripheral vascular disease, unspecified: Secondary | ICD-10-CM

## 2022-10-17 DIAGNOSIS — I96 Gangrene, not elsewhere classified: Secondary | ICD-10-CM

## 2022-10-17 NOTE — Telephone Encounter (Signed)
Pt's wife Levada Dy called to r/s AGM. She was given all pre op instructions and verbalized understanding.

## 2022-10-21 ENCOUNTER — Other Ambulatory Visit (HOSPITAL_COMMUNITY): Payer: Self-pay

## 2022-10-21 MED ORDER — OXYCODONE HCL 10 MG PO TABS
5.0000 mg | ORAL_TABLET | Freq: Four times a day (QID) | ORAL | 0 refills | Status: DC | PRN
  Filled 2022-10-21: qty 20, 5d supply, fill #0

## 2022-10-21 MED ORDER — DULOXETINE HCL 30 MG PO CPEP
60.0000 mg | ORAL_CAPSULE | Freq: Every day | ORAL | 1 refills | Status: AC
  Filled 2022-10-21: qty 60, 30d supply, fill #0
  Filled 2022-12-05: qty 60, 30d supply, fill #1

## 2022-10-22 ENCOUNTER — Other Ambulatory Visit (HOSPITAL_COMMUNITY): Payer: Self-pay

## 2022-10-23 ENCOUNTER — Telehealth: Payer: Self-pay

## 2022-10-23 NOTE — Telephone Encounter (Signed)
Patient contacted office advising that he received a letter from the hospital stating that he would be responsible for paying $7600 prior to aortogram procedure on March 21 and requested to speak with someone because he has been on worker's comp for 3 years and unable to afford this cost. Provided patient with the Hospital billing number. Patient agreed to contact office after speaking with the billing department if decides to cancel procedure.

## 2022-10-30 ENCOUNTER — Other Ambulatory Visit: Payer: Self-pay

## 2022-10-30 ENCOUNTER — Encounter (HOSPITAL_COMMUNITY)
Admission: RE | Disposition: A | Discharge status: Home / Self Care | Payer: Self-pay | Source: Home / Self Care | Attending: Vascular Surgery

## 2022-10-30 ENCOUNTER — Encounter (HOSPITAL_COMMUNITY): Payer: Self-pay | Admitting: Vascular Surgery

## 2022-10-30 ENCOUNTER — Other Ambulatory Visit (HOSPITAL_COMMUNITY): Payer: Self-pay

## 2022-10-30 ENCOUNTER — Ambulatory Visit (HOSPITAL_COMMUNITY)
Admission: RE | Admit: 2022-10-30 | Discharge: 2022-10-30 | Disposition: A | Discharge status: Home / Self Care | Payer: BC Managed Care – PPO | Attending: Vascular Surgery | Admitting: Vascular Surgery

## 2022-10-30 DIAGNOSIS — Z87891 Personal history of nicotine dependence: Secondary | ICD-10-CM | POA: Insufficient documentation

## 2022-10-30 DIAGNOSIS — E785 Hyperlipidemia, unspecified: Secondary | ICD-10-CM | POA: Insufficient documentation

## 2022-10-30 DIAGNOSIS — T8189XA Other complications of procedures, not elsewhere classified, initial encounter: Secondary | ICD-10-CM | POA: Insufficient documentation

## 2022-10-30 DIAGNOSIS — I10 Essential (primary) hypertension: Secondary | ICD-10-CM | POA: Insufficient documentation

## 2022-10-30 DIAGNOSIS — I70245 Atherosclerosis of native arteries of left leg with ulceration of other part of foot: Secondary | ICD-10-CM

## 2022-10-30 DIAGNOSIS — I2510 Atherosclerotic heart disease of native coronary artery without angina pectoris: Secondary | ICD-10-CM | POA: Insufficient documentation

## 2022-10-30 DIAGNOSIS — Y839 Surgical procedure, unspecified as the cause of abnormal reaction of the patient, or of later complication, without mention of misadventure at the time of the procedure: Secondary | ICD-10-CM | POA: Insufficient documentation

## 2022-10-30 DIAGNOSIS — I96 Gangrene, not elsewhere classified: Secondary | ICD-10-CM

## 2022-10-30 DIAGNOSIS — I70213 Atherosclerosis of native arteries of extremities with intermittent claudication, bilateral legs: Secondary | ICD-10-CM | POA: Diagnosis not present

## 2022-10-30 DIAGNOSIS — J449 Chronic obstructive pulmonary disease, unspecified: Secondary | ICD-10-CM | POA: Insufficient documentation

## 2022-10-30 DIAGNOSIS — I739 Peripheral vascular disease, unspecified: Secondary | ICD-10-CM

## 2022-10-30 HISTORY — PX: ABDOMINAL AORTOGRAM W/LOWER EXTREMITY: CATH118223

## 2022-10-30 HISTORY — PX: PERIPHERAL VASCULAR INTERVENTION: CATH118257

## 2022-10-30 LAB — POCT I-STAT, CHEM 8
BUN: 20 mg/dL (ref 6–20)
Calcium, Ion: 1.01 mmol/L — ABNORMAL LOW (ref 1.15–1.40)
Chloride: 102 mmol/L (ref 98–111)
Creatinine, Ser: 0.9 mg/dL (ref 0.61–1.24)
Glucose, Bld: 89 mg/dL (ref 70–99)
HCT: 53 % — ABNORMAL HIGH (ref 39.0–52.0)
Hemoglobin: 18 g/dL — ABNORMAL HIGH (ref 13.0–17.0)
Potassium: 5.3 mmol/L — ABNORMAL HIGH (ref 3.5–5.1)
Sodium: 138 mmol/L (ref 135–145)
TCO2: 30 mmol/L (ref 22–32)

## 2022-10-30 SURGERY — ABDOMINAL AORTOGRAM W/LOWER EXTREMITY
Anesthesia: LOCAL

## 2022-10-30 MED ORDER — CLOPIDOGREL BISULFATE 300 MG PO TABS
ORAL_TABLET | ORAL | Status: DC | PRN
  Administered 2022-10-30: 300 mg via ORAL

## 2022-10-30 MED ORDER — HEPARIN SODIUM (PORCINE) 1000 UNIT/ML IJ SOLN
INTRAMUSCULAR | Status: AC
  Filled 2022-10-30: qty 10

## 2022-10-30 MED ORDER — CLOPIDOGREL BISULFATE 300 MG PO TABS
ORAL_TABLET | ORAL | Status: AC
  Filled 2022-10-30: qty 1

## 2022-10-30 MED ORDER — ASPIRIN 81 MG PO TBEC: 81.0000 mg | DELAYED_RELEASE_TABLET | Freq: Every day | ORAL | Status: DC

## 2022-10-30 MED ORDER — CLOPIDOGREL BISULFATE 75 MG PO TABS: 300.0000 mg | ORAL_TABLET | Freq: Once | ORAL | Status: DC

## 2022-10-30 MED ORDER — ASPIRIN 81 MG PO CHEW
CHEWABLE_TABLET | ORAL | Status: DC | PRN
  Administered 2022-10-30: 81 mg via ORAL

## 2022-10-30 MED ORDER — IODIXANOL 320 MG/ML IV SOLN
INTRAVENOUS | Status: DC | PRN
  Administered 2022-10-30: 165 mL

## 2022-10-30 MED ORDER — LIDOCAINE HCL (PF) 1 % IJ SOLN
INTRAMUSCULAR | Status: AC
  Filled 2022-10-30: qty 30

## 2022-10-30 MED ORDER — MIDAZOLAM HCL 2 MG/2ML IJ SOLN
INTRAMUSCULAR | Status: AC
  Filled 2022-10-30: qty 2

## 2022-10-30 MED ORDER — ONDANSETRON HCL 4 MG/2ML IJ SOLN: 4.0000 mg | Freq: Four times a day (QID) | INTRAMUSCULAR | Status: DC | PRN

## 2022-10-30 MED ORDER — HYDRALAZINE HCL 20 MG/ML IJ SOLN: 5.0000 mg | INTRAMUSCULAR | Status: DC | PRN

## 2022-10-30 MED ORDER — CLOPIDOGREL BISULFATE 75 MG PO TABS: 75.0000 mg | ORAL_TABLET | Freq: Every day | ORAL | Status: DC

## 2022-10-30 MED ORDER — LABETALOL HCL 5 MG/ML IV SOLN: 10.0000 mg | INTRAVENOUS | Status: DC | PRN

## 2022-10-30 MED ORDER — FENTANYL CITRATE (PF) 100 MCG/2ML IJ SOLN
INTRAMUSCULAR | Status: DC | PRN
  Administered 2022-10-30 (×2): 25 ug via INTRAVENOUS

## 2022-10-30 MED ORDER — SODIUM CHLORIDE 0.9 % IV SOLN: INTRAVENOUS | Status: DC

## 2022-10-30 MED ORDER — ACETAMINOPHEN 325 MG PO TABS: 650.0000 mg | ORAL_TABLET | ORAL | Status: DC | PRN

## 2022-10-30 MED ORDER — LIDOCAINE HCL (PF) 1 % IJ SOLN
INTRAMUSCULAR | Status: DC | PRN
  Administered 2022-10-30: 10 mL via INTRADERMAL

## 2022-10-30 MED ORDER — HEPARIN SODIUM (PORCINE) 1000 UNIT/ML IJ SOLN
INTRAMUSCULAR | Status: DC | PRN
  Administered 2022-10-30: 10000 [IU] via INTRAVENOUS

## 2022-10-30 MED ORDER — FENTANYL CITRATE (PF) 100 MCG/2ML IJ SOLN
INTRAMUSCULAR | Status: AC
  Filled 2022-10-30: qty 2

## 2022-10-30 MED ORDER — ASPIRIN 81 MG PO CHEW
CHEWABLE_TABLET | ORAL | Status: AC
  Filled 2022-10-30: qty 1

## 2022-10-30 MED ORDER — MIDAZOLAM HCL 2 MG/2ML IJ SOLN
INTRAMUSCULAR | Status: DC | PRN
  Administered 2022-10-30 (×2): 1 mg via INTRAVENOUS

## 2022-10-30 MED ORDER — SODIUM CHLORIDE 0.9% FLUSH: 3.0000 mL | INTRAVENOUS | Status: DC | PRN

## 2022-10-30 MED ORDER — SODIUM CHLORIDE 0.9% FLUSH: 3.0000 mL | Freq: Two times a day (BID) | INTRAVENOUS | Status: DC

## 2022-10-30 MED ORDER — HEPARIN (PORCINE) IN NACL 1000-0.9 UT/500ML-% IV SOLN
INTRAVENOUS | Status: DC | PRN
  Administered 2022-10-30 (×2): 500 mL

## 2022-10-30 MED ORDER — CLOPIDOGREL BISULFATE 75 MG PO TABS
75.0000 mg | ORAL_TABLET | Freq: Every day | ORAL | 11 refills | Status: AC
  Filled 2022-10-30: qty 30, 30d supply, fill #0
  Filled 2022-11-25: qty 30, 30d supply, fill #1

## 2022-10-30 MED ORDER — SODIUM CHLORIDE 0.9 % IV SOLN: 250.0000 mL | INTRAVENOUS | Status: DC | PRN

## 2022-10-30 SURGICAL SUPPLY — 25 items
BALLN MUSTANG 6X150X135 (BALLOONS) ×2
BALLN MUSTANG 6X60X75 (BALLOONS) ×2
BALLOON MUSTANG 6X150X135 (BALLOONS) IMPLANT
BALLOON MUSTANG 6X60X75 (BALLOONS) IMPLANT
CATH OMNI FLUSH 5F 65CM (CATHETERS) IMPLANT
CATH QUICKCROSS SUPP .035X90CM (MICROCATHETER) IMPLANT
DEVICE CLOSURE MYNXGRIP 6/7F (Vascular Products) IMPLANT
GLIDEWIRE ADV .035X260CM (WIRE) IMPLANT
KIT ENCORE 26 ADVANTAGE (KITS) IMPLANT
KIT MICROPUNCTURE NIT STIFF (SHEATH) IMPLANT
KIT PV (KITS) ×2 IMPLANT
SHEATH CATAPULT 6FR 45 (SHEATH) IMPLANT
SHEATH PINNACLE 5F 10CM (SHEATH) IMPLANT
SHEATH PINNACLE 6F 10CM (SHEATH) IMPLANT
SHEATH PROBE COVER 6X72 (BAG) IMPLANT
STENT ELUVIA 7X120X130 (Permanent Stent) IMPLANT
STENT ELUVIA 7X150X130 (Permanent Stent) IMPLANT
STENT ELUVIA 7X60X130 (Permanent Stent) IMPLANT
STOPCOCK MORSE 400PSI 3WAY (MISCELLANEOUS) IMPLANT
SYR MEDRAD MARK 7 150ML (SYRINGE) ×2 IMPLANT
TRANSDUCER W/STOPCOCK (MISCELLANEOUS) ×2 IMPLANT
TRAY PV CATH (CUSTOM PROCEDURE TRAY) ×2 IMPLANT
TUBING CIL FLEX 10 FLL-RA (TUBING) IMPLANT
WIRE BENTSON .035X145CM (WIRE) IMPLANT
WIRE TORQFLEX AUST .018X40CM (WIRE) IMPLANT

## 2022-10-30 NOTE — Op Note (Signed)
Patient name: [name] [name] MRN: [id number] DOB: 1966/07/06 Sex: male  10/30/2022 Pre-operative Diagnosis: Left lower extremity nonhealing fifth toe amputation with evidence of PAD Post-operative diagnosis:  Same Surgeon:  Marty Heck, MD Procedure Performed: 1.  Ultrasound-guided access right common femoral artery 2.  Aortogram with catheter selection of aorta 3.  Bilateral lower extremity arteriogram with selection of third order branches in the left lower extremity 4.  Left SFA angioplasty with stent placement x 3 (7 mm x 150 mm Eluvia, 7 mm x 120 mm Eluvia and 7 mm x 80 mm Eluvia postdilated with a 6 mm Mustang) 5.  Mynx closure of the right common femoral artery 6.  64 minutes of monitored moderate conscious sedation time  Indications: 57 year old male seen in the office with history of bilateral lower extremity claudication but also now has a nonhealing left fifth toe amputation.  He had marginal toe pressures on the left.  He presents for lower extremity arteriogram with possible intervention with initial focus on the left leg after risks and benefits discussed.  Contrast: 165 mL  Findings:   Aortogram showed ectasia of the infrarenal aorta.  The left renal artery was visualized and widely patent.  The left common iliac artery had an ulcerated plaque.  There was no flow-limiting stenosis in the aortoiliac segment.  Left lower extremity arteriogram showed widely patent common femoral and profunda.  The proximal SFA was patent but diseased.  He had a long segment chronic total occlusion of the SFA with reconstitution at Hunter's canal.  Distally he had patent popliteal artery with three-vessel runoff.  The left SFA occlusion was crossed with contralateral groin access in antegrade fashion.  This was stented with a 7 mm x 150 mm Eluvia, 7 mm x 120 mm Eluvia, and 7 mm x 80 mm Eluvia..  Widely patent stents at completion with inline flow at completion.  Right lower extremity  runoff showed a patent common femoral and profunda with a short segment distal SFA occlusion.  Patent popliteal artery with three-vessel runoff.   Procedure:  The patient was identified in the holding area and taken to room 7.  The patient was then placed supine on the table and prepped and draped in the usual sterile fashion.  A time out was called.  The patient received Versed and fentanyl for conscious moderate sedation.  Vital signs were monitored including heart rate, respiratory rate, oxygenation and blood pressure.  I was present for all of sedation.  Ultrasound was used to evaluate the right common femoral artery.  It was patent .  A digital ultrasound image was acquired.  A micropuncture needle was used to access the right common femoral artery under ultrasound guidance.  An 018 wire was advanced without resistance and a micropuncture sheath was placed.  The 018 wire was removed and a benson wire was placed.  The micropuncture sheath was exchanged for a 5 french sheath.  An omniflush catheter was advanced over the wire to the level of L-1.  An abdominal angiogram was obtained.  Next the catheter was pulled down and bilateral lower extremity runoff was obtained.  Pertinent findings are noted above.  We elected for intervention on the left SFA chronic total occlusion given he has left lower extremity tissue loss.  I then used a Glidewire advantage with a Omni Flush catheter to cross the aortic bifurcation and got my wire into the left SFA.  I put a long 6 [place] catapault sheath in the right  groin over the aortic bifurcation into the left common femoral artery.  Patient was given 100 units/kg IV heparin.  I used a quick cross catheter with the Glidewire and I was able to cross the left SFA chronic total occlusion and get into the above-knee popliteal artery.  I confirmed with hand-injection that I was in the true lumen distally.  Over my wire I then placed a 7 mm x 150 mm Eluvia, 7 mm x 120 mm Eluvia and a  7 mm x 80 mm Eluvia all postdilated with 6 mm Mustang.  Widely patent stents with inline flow and three-vessel runoff distally.  Wires and catheters were removed.  We did get a groin access shot on the right.  A mynx closure device was deployed.   Plan: Excellent results after left leg intervention with inline flow for tissue loss.  Patient is on aspirin statin and will add Plavix.  I will see him in 1 month with noninvasive imaging.  We can make it a future decision about the right leg.  Marty Heck, MD [facility] Office: [phone]

## 2022-10-30 NOTE — H&P (Signed)
Patient name: [name] [name]   MRN: [id number]        DOB: 03-01-1966          Sex: male   REASON FOR CONSULT: Evaluate vascular insufficiency of bilateral feet    HPI: [name] [name] is a 57 y.o. male, with history of COPD, coronary disease, hypertension, hyperlipidemia, tobacco abuse that presents for evaluation of arterial insufficiency of his bilateral lower extremities.  Patient describes cramping in both calves particularly worse in the right leg.  Patient previously had left fifth toe gangrene requiring a partial toe amputation on 06/26/2022 by Ortho.  This was related to a cat bite.  He had ABIs on 06/26/2022 that were 0.65 on the right and 0.9 on the left.  States he has been a lifetime smoker.  Putting betadine paint on the left 5th toe.       Past Medical History:  Diagnosis Date   Anxiety     Chronic pain disorder     COPD (chronic obstructive pulmonary disease) (HCC)      smoker   Coronary artery disease      a. NSTEMI 01/2013 s/p PTCA to prox RCA occlusion.   GERD (gastroesophageal reflux disease)     Gout     Headache(784.0)     HLD (hyperlipidemia)     Hypertension     Intercostal neuralgia      multiple rib fractures 2022   Screening for chemical poisoning and contamination     Tobacco abuse             Past Surgical History:  Procedure Laterality Date   AMPUTATION TOE Left 06/26/2022    Procedure: AMPUTATION TOE 5th toe;  Surgeon: Armond Hang, MD;  Location: [place];  Service: Orthopedics;  Laterality: Left;  choice   CARDIAC CATHETERIZATION       ELBOW SURGERY        from [place]   KNEE SURGERY   2009    left knee orthroscopy   LEFT HEART CATHETERIZATION WITH CORONARY ANGIOGRAM N/A 01/10/2013    Procedure: LEFT HEART CATHETERIZATION WITH CORONARY ANGIOGRAM;  Surgeon: Thayer Headings, MD;  Location: [facility] CATH LAB;  Service: Cardiovascular;  Laterality: N/A;   RIB PLATING Right 09/17/2020    Procedure: RIB PLATING 5-9;  Surgeon: Melrose Nakayama, MD;  Location: [facility] OR;  Service: Thoracic;  Laterality: Right;           Family History  Problem Relation Age of Onset   CAD Brother 61   Stroke Father     COPD Sister     Drug abuse Brother     Healthy Son     Healthy Son     Healthy Daughter        SOCIAL HISTORY: Social History         Socioeconomic History   Marital status: Married      Spouse name: Not on file   Number of children: Not on file   Years of education: Not on file   Highest education level: Not on file  Occupational History   Not on file  Tobacco Use   Smoking status: Every Day      Packs/day: 1.00      Years: 15.00      Total pack years: 15.00      Types: Cigarettes      Last attempt to quit: 01/09/2013      Years since quitting: 9.6   Smokeless tobacco:  Never  Vaping Use   Vaping Use: Never used  Substance and Sexual Activity   Alcohol use: Yes      Comment: occ   Drug use: No   Sexual activity: Yes  Other Topics Concern   Not on file  Social History Narrative   Not on file    Social Determinants of Health    Financial Resource Strain: Not on file  Food Insecurity: Not on file  Transportation Needs: Not on file  Physical Activity: Not on file  Stress: Not on file  Social Connections: Not on file  Intimate Partner Violence: Not on file      No Known Allergies         Current Outpatient Medications  Medication Sig Dispense Refill   allopurinol (ZYLOPRIM) 100 MG tablet Take 1 tablet (100 mg total) by mouth daily. 90 tablet 0   ALPRAZolam (XANAX) 1 MG tablet Take 1 tablet (1 mg total) by mouth 4 (four) times daily as needed. 120 tablet 1   aspirin EC (ASPIRIN 81) 81 MG tablet Take 1 tablet (81 mg total) by mouth daily with food 90 tablet 0   aspirin EC (ASPIRIN LOW DOSE) 81 MG tablet Take 1 tablet (81 mg total) by mouth 2 (two) times daily for 28 days. 56 tablet 0   cephALEXin (KEFLEX) 500 MG capsule Take 1 capsule (500 mg total) by mouth every 6 (six) hours for 14 days 56  capsule 0   docusate sodium (COLACE) 100 MG capsule Take 1 capsule (100 mg total) by mouth 2 (two) times daily for 28 days. 56 capsule 0   metoprolol succinate (TOPROL-XL) 50 MG 24 hr tablet Take 1 tablet (50 mg total) by mouth daily. 90 tablet 0   nitroGLYCERIN (NITROSTAT) 0.4 MG SL tablet Place 1 tablet (0.4 mg total) under the tongue every 5 (five) minutes as needed for chest pain (up to 3 doses). 25 tablet 4   nitroGLYCERIN (NITROSTAT) 0.4 MG SL tablet Place 1 tablet (0.4 mg total) under the tongue as needed for chest pain 25 tablet 1   omega-3 acid ethyl esters (LOVAZA) 1 g capsule Take 2 capsules (2 g total) by mouth 2 (two) times daily. 360 capsule 0   omeprazole (PRILOSEC) 40 MG capsule Take 1 capsule (40 mg total) by mouth every morning. 30 capsule 0   ondansetron (ZOFRAN) 4 MG tablet Take 1 tablet (4 mg total) by mouth 2 (two) times daily as needed for 7 days. 14 tablet 0   oxyCODONE (OXY IR/ROXICODONE) 5 MG immediate release tablet Take 1 tablet (5 mg total) by mouth every 6-8 hours for 7 days 28 tablet 0   oxyCODONE (OXY IR/ROXICODONE) 5 MG immediate release tablet Take 1 tablet (5 mg total) by mouth every 6-8 hours for 7 days 28 tablet 0   oxyCODONE (ROXICODONE) 5 MG immediate release tablet Take 1 tablet (5 mg total) by mouth every 6 (six) hours as needed for up to 10 doses for severe pain or breakthrough pain. 10 tablet 0   pregabalin (LYRICA) 150 MG capsule Take 1 capsule (150 mg total) by mouth 2 (two) times daily. 60 capsule 1   rosuvastatin (CRESTOR) 40 MG tablet Take 1 tablet (40 mg total) by mouth at bedtime. 90 tablet 0   sildenafil (VIAGRA) 100 MG tablet Take 1 tablet (100 mg total) by mouth as needed, use 2-3 hours before sexual activity, max of 8-10 tablets in a month 30 tablet 0   tiZANidine (ZANAFLEX) 4  MG tablet Take 1 tablet (4 mg total) by mouth 2 (two) times daily as needed. 60 tablet 1   Turmeric 500 MG CAPS Take by mouth.        No current facility-administered  medications for this visit.      REVIEW OF SYSTEMS:  [X]  denotes positive finding, [ ]  denotes negative finding Cardiac   Comments:  Chest pain or chest pressure:      Shortness of breath upon exertion:      Short of breath when lying flat:      Irregular heart rhythm:             Vascular      Pain in calf, thigh, or hip brought on by ambulation: x Worse in right leg  Pain in feet at night that wakes you up from your sleep:       Blood clot in your veins:      Leg swelling:              Pulmonary      Oxygen at home:      Productive cough:       Wheezing:              Neurologic      Sudden weakness in arms or legs:       Sudden numbness in arms or legs:       Sudden onset of difficulty speaking or slurred speech:      Temporary loss of vision in one eye:       Problems with dizziness:              Gastrointestinal      Blood in stool:       Vomited blood:              Genitourinary      Burning when urinating:       Blood in urine:             Psychiatric      Major depression:              Hematologic      Bleeding problems:      Problems with blood clotting too easily:             Skin      Rashes or ulcers:             Constitutional      Fever or chills:          PHYSICAL EXAM: There were no vitals filed for this visit.   GENERAL: The patient is a well-nourished male, in no acute distress. The vital signs are documented above. CARDIAC: There is a regular rate and rhythm.  VASCULAR:  Bilateral femoral pulses palpable No palpable right pedal pulses Weakly palpable left PT Left fifth toe amputation with eschar as pictured. PULMONARY: No respiratory distress. ABDOMEN: Soft and non-tender. MUSCULOSKELETAL: There are no major deformities or cyanosis. NEUROLOGIC: No focal weakness or paresthesias are detected. PSYCHIATRIC: The patient has a normal affect.      DATA:    ABIs on 06/26/2022 are 0.65 on the right and 0.9 on the left    Assessment/Plan:   57 y.o. male, with history of COPD, coronary disease, hypertension, hyperlipidemia, tobacco abuse that presents for evaluation of arterial insufficiency of his bilateral lower extremities.  He endorses bilateral calf claudication worse in the right leg consistent with PAD.  His ABIs are more  depressed on the right at 0.65.  However, I discussed my bigger concern is his left fifth toe amputation that appears to have a nonhealing eschar.  Although his ABI on the left is higher at 0.9 he has marginal toe pressure of 65 and discussed this could lead to delayed wound healing.  I have recommended aortogram lower extremity arteriogram with initial focus on the left leg through a right transfemoral approach.  Discussed possible endovascular intervention.  Will also shoot right lower extremity runoff as well but discussed claudication is typically managed with lifestyle modification.  Risk benefits discussed.       Marty Heck, MD [facility] Office: [phone]

## 2022-10-30 NOTE — Progress Notes (Signed)
Client up and walked and tolerated well; right groin stable, no bleeding or hematoma 

## 2022-10-31 ENCOUNTER — Telehealth: Payer: Self-pay

## 2022-10-31 ENCOUNTER — Other Ambulatory Visit: Payer: Self-pay | Admitting: Physician Assistant

## 2022-10-31 NOTE — Telephone Encounter (Signed)
Pt's wife, [name] [name], called stating that the pt had a procedure yesterday with 3 stents placed and was having some issues.  Reviewed pt's chart, returned call for clarification, two identifiers used. Pt stated that he was having a lot of soreness and pain in his L leg. Informed him that was normal and to be expected with the angioplasty and stents. He is already taking Oxycodone for his broken ribs. Informed him that pain meds aren't usually prescribed for his type of procedure and he was already taking pain meds, so it was unlikely that he could get another prescription, but a provider would be consulted. He stated that he didn't want to take the pain meds for his ribs for his leg and then run out of those.  Spoke to [place], [place] who refused any pain meds.  Called pt and informed him. Confirmed understanding.

## 2022-11-03 ENCOUNTER — Telehealth: Payer: Self-pay | Admitting: Vascular Surgery

## 2022-11-03 DIAGNOSIS — M25562 Pain in left knee: Secondary | ICD-10-CM | POA: Diagnosis not present

## 2022-11-03 NOTE — Telephone Encounter (Signed)
-----   Message from Marty Heck, MD sent at 10/30/2022 10:02 AM EDT ----- Patient name: [name] [name]   MRN: [id number]        DOB: August 05, 1966          Sex: male   10/30/2022 Pre-operative Diagnosis: Left lower extremity nonhealing fifth toe amputation with evidence of PAD Post-operative diagnosis:  Same Surgeon:  Marty Heck, MD Procedure Performed: 1.  Ultrasound-guided access right common femoral artery 2.  Aortogram with catheter selection of aorta 3.  Bilateral lower extremity arteriogram with selection of third order branches in the left lower extremity 4.  Left SFA angioplasty with stent placement x 3 (7 mm x 150 mm Eluvia, 7 mm x 120 mm Eluvia and 7 mm x 80 mm Eluvia postdilated with a 6 mm Mustang) 5.  Mynx closure of the right common femoral artery 6.  64 minutes of monitored moderate conscious sedation time   #Can you arrange follow-up with me in one month with left leg arterial duplex and ABI?  Thanks,  Gerald Stabs

## 2022-11-06 NOTE — Telephone Encounter (Signed)
Appt has been scheduled.

## 2022-11-07 ENCOUNTER — Other Ambulatory Visit (HOSPITAL_COMMUNITY): Payer: Self-pay

## 2022-11-19 ENCOUNTER — Other Ambulatory Visit: Payer: Self-pay | Admitting: *Deleted

## 2022-11-19 DIAGNOSIS — I739 Peripheral vascular disease, unspecified: Secondary | ICD-10-CM

## 2022-11-25 ENCOUNTER — Other Ambulatory Visit (HOSPITAL_COMMUNITY): Payer: Self-pay

## 2022-11-26 ENCOUNTER — Other Ambulatory Visit (HOSPITAL_COMMUNITY): Payer: Self-pay

## 2022-11-26 MED ORDER — ALPRAZOLAM 1 MG PO TABS
1.0000 mg | ORAL_TABLET | Freq: Four times a day (QID) | ORAL | 2 refills | Status: AC | PRN
  Filled 2022-11-26: qty 120, 30d supply, fill #0

## 2022-11-28 ENCOUNTER — Other Ambulatory Visit (HOSPITAL_COMMUNITY): Payer: Self-pay

## 2022-12-01 ENCOUNTER — Other Ambulatory Visit (HOSPITAL_COMMUNITY): Payer: Self-pay

## 2022-12-01 ENCOUNTER — Other Ambulatory Visit: Payer: Self-pay

## 2022-12-02 ENCOUNTER — Ambulatory Visit (INDEPENDENT_AMBULATORY_CARE_PROVIDER_SITE_OTHER)
Admission: RE | Admit: 2022-12-02 | Discharge: 2022-12-02 | Disposition: A | Discharge status: Home / Self Care | Payer: BC Managed Care – PPO | Source: Ambulatory Visit | Attending: Vascular Surgery | Admitting: Vascular Surgery

## 2022-12-02 ENCOUNTER — Ambulatory Visit (INDEPENDENT_AMBULATORY_CARE_PROVIDER_SITE_OTHER): Payer: BC Managed Care – PPO | Admitting: Vascular Surgery

## 2022-12-02 ENCOUNTER — Ambulatory Visit (HOSPITAL_COMMUNITY)
Admission: RE | Admit: 2022-12-02 | Discharge: 2022-12-02 | Disposition: A | Discharge status: Home / Self Care | Payer: BC Managed Care – PPO | Source: Ambulatory Visit | Attending: Vascular Surgery | Admitting: Vascular Surgery

## 2022-12-02 VITALS — BP 131/80 | HR 81 | Temp 98.2°F | Resp 18 | Ht 72.0 in | Wt 212.0 lb

## 2022-12-02 DIAGNOSIS — I739 Peripheral vascular disease, unspecified: Secondary | ICD-10-CM | POA: Diagnosis not present

## 2022-12-02 DIAGNOSIS — I70222 Atherosclerosis of native arteries of extremities with rest pain, left leg: Secondary | ICD-10-CM | POA: Insufficient documentation

## 2022-12-02 LAB — VAS US ABI WITH/WO TBI
Left ABI: 1.09
Right ABI: 0.71

## 2022-12-02 NOTE — Progress Notes (Signed)
Patient name: [name] [name] MRN: [id number] DOB: Jan 05, 1966 Sex: male  REASON FOR VISIT: Follow-up after left leg revascularization for non-healing left 5th toe amputation  HPI: [name] [name] is a 57 y.o. male with history of COPD, coronary artery disease, hypertension, hyperlipidemia, tobacco abuse that presents for follow-up after recent left leg revascularization.  He was seen with nonhealing left fifth toe amputation performed on 06/26/2022 by orthopedics.  He most recently underwent left SFA angioplasty with stent placement x 3 for a long segment CTO on 10/30/2022.  States he a lot of swelling in his left knee after the procedure and thinks he tweaked his knee requiring getting fluid removed.  Has not walked much on the leg since.  Right Has been bothering him which is the contralateral leg.  The left toe is healing.  Past Medical History:  Diagnosis Date   Anxiety    Chronic pain disorder    COPD (chronic obstructive pulmonary disease) (HCC)    smoker   Coronary artery disease    a. NSTEMI 01/2013 s/p PTCA to prox RCA occlusion.   GERD (gastroesophageal reflux disease)    Gout    Headache(784.0)    HLD (hyperlipidemia)    Hypertension    Intercostal neuralgia    multiple rib fractures 2022   Screening for chemical poisoning and contamination    Tobacco abuse     Past Surgical History:  Procedure Laterality Date   ABDOMINAL AORTOGRAM W/LOWER EXTREMITY N/A 10/30/2022   Procedure: ABDOMINAL AORTOGRAM W/LOWER EXTREMITY;  Surgeon: Cephus Shelling, MD;  Location: [facility]VASIVE [facility];  Service: Cardiovascular;  Laterality: N/A;   AMPUTATION TOE Left 06/26/2022   Procedure: AMPUTATION TOE 5th toe;  Surgeon: Netta Cedars, MD;  Location: [facility];  Service: Orthopedics;  Laterality: Left;  choice   CARDIAC CATHETERIZATION     ELBOW SURGERY     from [place]   KNEE SURGERY  2009   left knee orthroscopy   LEFT HEART CATHETERIZATION WITH CORONARY ANGIOGRAM N/A  01/10/2013   Procedure: LEFT HEART CATHETERIZATION WITH CORONARY ANGIOGRAM;  Surgeon: [name], MD;  [facility]: [facility] CATH LAB;  Service: Cardiovascular;  Laterality: N/A;   PERIPHERAL VASCULAR INTERVENTION Left 10/30/2022   Procedure: PERIPHERAL VASCULAR INTERVENTION;  Surgeon: Cephus Shelling, MD;  Location: [facility] INVASIVE CV LAB;  Service: Cardiovascular;  Laterality: Left;   RIB PLATING Right 09/17/2020   Procedure: RIB PLATING 5-9;  Surgeon: Loreli Slot, MD;  Location: [facility] OR;  Service: Thoracic;  Laterality: Right;    Family History  Problem Relation Age of Onset   CAD Brother 74   Stroke Father    COPD Sister    Drug abuse Brother    Healthy Son    Healthy Son    Healthy Daughter     SOCIAL HISTORY: Social History   Tobacco Use   Smoking status: Every Day    Packs/day: 1.00    Years: 15.00    Additional pack years: 0.00    Total pack years: 15.00    Types: Cigarettes    Last attempt to quit: 01/09/2013    Years since quitting: 9.9   Smokeless tobacco: Never  Substance Use Topics   Alcohol use: Yes    Comment: occ    No Known Allergies  Current Outpatient Medications  Medication Sig Dispense Refill   ALPRAZolam (XANAX) 1 MG tablet Take 1 tablet (1 mg total) by mouth 4 (four) times daily as needed. 120 tablet 2  clopidogrel (PLAVIX) 75 MG tablet Take 1 tablet (75 mg total) by mouth daily. 30 tablet 11   DULoxetine (CYMBALTA) 30 MG capsule Take 2 capsules (60 mg total) by mouth daily. (Patient taking differently: Take 30 mg by mouth 2 (two) times daily.) 60 capsule 1   metoprolol succinate (TOPROL-XL) 50 MG 24 hr tablet Take 1 tablet (50 mg total) by mouth daily. 90 tablet 0   nitroGLYCERIN (NITROSTAT) 0.4 MG SL tablet Place 1 tablet (0.4 mg total) under the tongue as needed for chest pain 25 tablet 1   Oxycodone HCl 10 MG TABS Take 1/2-1 tablet (5-10 mg total) by mouth every 6 (six) hours as needed for severe pain (7-10) for up to 5 days. 20 tablet 0    pregabalin (LYRICA) 150 MG capsule Take 1 capsule (150 mg total) by mouth 2 (two) times daily. 60 capsule 1   rosuvastatin (CRESTOR) 40 MG tablet Take 1 tablet (40 mg total) by mouth at bedtime. 90 tablet 0   allopurinol (ZYLOPRIM) 100 MG tablet Take 1 tablet (100 mg total) by mouth daily. (Patient not taking: Reported on 10/29/2022) 90 tablet 0   aspirin EC (ASPIRIN 81) 81 MG tablet Take 1 tablet (81 mg total) by mouth daily with food (Patient not taking: Reported on 10/29/2022) 90 tablet 0   aspirin EC (ASPIRIN LOW DOSE) 81 MG tablet Take 1 tablet (81 mg total) by mouth 2 (two) times daily for 28 days. (Patient not taking: Reported on 09/02/2022) 56 tablet 0   docusate sodium (COLACE) 100 MG capsule Take 1 capsule (100 mg total) by mouth 2 (two) times daily for 28 days. (Patient not taking: Reported on 10/29/2022) 56 capsule 0   omega-3 acid ethyl esters (LOVAZA) 1 g capsule Take 2 capsules (2 g total) by mouth 2 (two) times daily. (Patient not taking: Reported on 10/29/2022) 360 capsule 0   omeprazole (PRILOSEC) 40 MG capsule Take 1 capsule (40 mg total) by mouth every morning. (Patient not taking: Reported on 10/29/2022) 30 capsule 0   ondansetron (ZOFRAN) 4 MG tablet Take 1 tablet (4 mg total) by mouth 2 (two) times daily as needed for 7 days. (Patient not taking: Reported on 10/29/2022) 14 tablet 0   sildenafil (VIAGRA) 100 MG tablet Take 1 tablet (100 mg total) by mouth as needed, use 2-3 hours before sexual activity, max of 8-10 tablets in a month (Patient not taking: Reported on 10/29/2022) 30 tablet 0   tiZANidine (ZANAFLEX) 4 MG tablet Take 1 tablet (4 mg total) by mouth 2 (two) times daily as needed. (Patient not taking: Reported on 10/29/2022) 60 tablet 1   No current facility-administered medications for this visit.    REVIEW OF SYSTEMS:   denotes positive finding,  denotes negative finding Cardiac  Comments:  Chest pain or chest pressure:    Shortness of breath upon exertion:    Short  of breath when lying flat:    Irregular heart rhythm:        Vascular    Pain in calf, thigh, or hip brought on by ambulation:    Pain in feet at night that wakes you up from your sleep:     Blood clot in your veins:    Leg swelling:         Pulmonary    Oxygen at home:    Productive cough:     Wheezing:         Neurologic    Sudden weakness in arms or  legs:     Sudden numbness in arms or legs:     Sudden onset of difficulty speaking or slurred speech:    Temporary loss of vision in one eye:     Problems with dizziness:         Gastrointestinal    Blood in stool:     Vomited blood:         Genitourinary    Burning when urinating:     Blood in urine:        Psychiatric    Major depression:         Hematologic    Bleeding problems:    Problems with blood clotting too easily:        Skin    Rashes or ulcers:        Constitutional    Fever or chills:      PHYSICAL EXAM: Vitals:   12/02/22 1320  BP: 131/80  Pulse: 81  Resp: 18  Temp: 98.2 F (36.8 C)  TempSrc: Temporal  SpO2: 93%  Weight: 212 lb (96.2 kg)  Height: 6' (1.829 m)    GENERAL: The patient is a well-nourished male, in no acute distress. The vital signs are documented above. CARDIAC: There is a regular rate and rhythm.  VASCULAR:  Bilateral femoral pulses palpable Left DP/PT palpable Left 5th toe amputation healing with small scab PULMONARY: No respiratory distress. ABDOMEN: Soft and non-tender. MUSCULOSKELETAL: There are no major deformities or cyanosis. NEUROLOGIC: No focal weakness or paresthesias are detected. PSYCHIATRIC: The patient has a normal affect.  DATA:   Left leg arterial duplex shows widely patent SFA stents  ABIs today are 0.71 on the right monophasic and 1.09 on the left multiphasic  Assessment/Plan:   57 y.o. male with history of COPD, coronary artery disease, hypertension, hyperlipidemia, tobacco abuse that presents for follow-up after recent left leg  revascularization.  He was seen with nonhealing left fifth toe amputation performed on 06/26/2022 by orthopedics.  He most recently underwent left SFA angioplasty with stent placement x 3 for a long segment CTO on 10/30/2022.  Discussed that his duplex today shows that his left SFA stents are widely patent.  He has palpable pedal pulses.  His toe amputation site is healing.  I want him to walk on this leg to see what kind of improvement he has from a claudication standpoint before any intervention on the right leg.  I discussed claudication intervention is elective whereas the left leg intervention was necessary for non-healing toe amputation that can lead to limb loss.  I will see him in 6 months with repeat noninvasive imaging.  Discussed aspirin statin and plavix.   Cephus Shelling, MD [facility] Office: [phone]

## 2022-12-03 DIAGNOSIS — M2392 Unspecified internal derangement of left knee: Secondary | ICD-10-CM | POA: Diagnosis not present

## 2022-12-05 ENCOUNTER — Other Ambulatory Visit (HOSPITAL_COMMUNITY): Payer: Self-pay

## 2022-12-10 ENCOUNTER — Other Ambulatory Visit: Payer: Self-pay

## 2022-12-10 DIAGNOSIS — I70222 Atherosclerosis of native arteries of extremities with rest pain, left leg: Secondary | ICD-10-CM

## 2022-12-10 DIAGNOSIS — I739 Peripheral vascular disease, unspecified: Secondary | ICD-10-CM

## 2022-12-24 ENCOUNTER — Other Ambulatory Visit (HOSPITAL_COMMUNITY): Payer: Self-pay

## 2022-12-25 ENCOUNTER — Other Ambulatory Visit (HOSPITAL_COMMUNITY): Payer: Self-pay

## 2022-12-25 MED ORDER — OXYCODONE HCL 10 MG PO TABS
10.0000 mg | ORAL_TABLET | Freq: Four times a day (QID) | ORAL | 0 refills | Status: DC | PRN
  Filled 2022-12-25: qty 20, 5d supply, fill #0

## 2023-01-05 DIAGNOSIS — J209 Acute bronchitis, unspecified: Secondary | ICD-10-CM | POA: Diagnosis not present

## 2023-01-05 DIAGNOSIS — R051 Acute cough: Secondary | ICD-10-CM | POA: Diagnosis not present

## 2023-01-05 DIAGNOSIS — R0981 Nasal congestion: Secondary | ICD-10-CM | POA: Diagnosis not present

## 2023-01-06 ENCOUNTER — Other Ambulatory Visit (HOSPITAL_COMMUNITY): Payer: Self-pay

## 2023-02-27 ENCOUNTER — Other Ambulatory Visit (HOSPITAL_COMMUNITY): Payer: Self-pay

## 2023-02-27 MED ORDER — OXYCODONE HCL 10 MG PO TABS
10.0000 mg | ORAL_TABLET | Freq: Four times a day (QID) | ORAL | 0 refills | Status: DC | PRN
  Filled 2023-02-27: qty 20, 5d supply, fill #0

## 2023-03-12 DIAGNOSIS — F410 Panic disorder [episodic paroxysmal anxiety] without agoraphobia: Secondary | ICD-10-CM | POA: Diagnosis not present

## 2023-04-28 ENCOUNTER — Other Ambulatory Visit: Payer: Self-pay

## 2023-04-28 ENCOUNTER — Other Ambulatory Visit (HOSPITAL_COMMUNITY): Payer: Self-pay

## 2023-04-28 MED ORDER — PREGABALIN 200 MG PO CAPS
200.0000 mg | ORAL_CAPSULE | Freq: Two times a day (BID) | ORAL | 2 refills | Status: DC
  Filled 2023-04-28: qty 60, 30d supply, fill #0

## 2023-04-29 ENCOUNTER — Other Ambulatory Visit (HOSPITAL_COMMUNITY): Payer: Self-pay

## 2023-05-04 ENCOUNTER — Emergency Department (HOSPITAL_COMMUNITY): Admission: EM | Admit: 2023-05-04 | Discharge: 2023-05-04 | Discharge status: Against Medical Advice | Payer: Self-pay

## 2023-05-04 ENCOUNTER — Other Ambulatory Visit: Payer: Self-pay | Admitting: Thoracic Surgery (Cardiothoracic Vascular Surgery)

## 2023-05-04 DIAGNOSIS — S2241XK Multiple fractures of ribs, right side, subsequent encounter for fracture with nonunion: Secondary | ICD-10-CM

## 2023-05-04 NOTE — ED Notes (Signed)
Pt eloped from lobby.

## 2023-05-05 ENCOUNTER — Ambulatory Visit: Payer: Self-pay | Admitting: Thoracic Surgery (Cardiothoracic Vascular Surgery)

## 2023-05-05 ENCOUNTER — Encounter: Payer: Self-pay | Admitting: Thoracic Surgery (Cardiothoracic Vascular Surgery)

## 2023-05-12 ENCOUNTER — Telehealth: Payer: Self-pay

## 2023-05-12 NOTE — Telephone Encounter (Signed)
Caller: Pt's wife, Marylene Land, & pt  Concern: foot swollen, scab on toe amp site came off, pt expressed thick yellow pus from wound, soreness with ambulation, redness, can't wear shoes  Pt denies fever, chills, N/V  Location: left foot, 5th digit amp site  Description:  approx 2 wks  Aggravating Factors: walking  Treatments:  pt's wife is a nurse, she's been supplying him with dressing supplies, pt keeping clean, dry, coated with iodine, and a clean dressing on it  Resolution: Appointment scheduled for next available triage  Next Appt: Appointment scheduled for 10/3 @ 0800 [place] and [place]

## 2023-05-14 ENCOUNTER — Ambulatory Visit (INDEPENDENT_AMBULATORY_CARE_PROVIDER_SITE_OTHER)
Admission: RE | Admit: 2023-05-14 | Discharge: 2023-05-14 | Disposition: A | Discharge status: Home / Self Care | Payer: BC Managed Care – PPO | Source: Ambulatory Visit | Attending: Vascular Surgery | Admitting: Vascular Surgery

## 2023-05-14 ENCOUNTER — Ambulatory Visit (HOSPITAL_COMMUNITY)
Admission: RE | Admit: 2023-05-14 | Discharge: 2023-05-14 | Disposition: A | Discharge status: Home / Self Care | Payer: BC Managed Care – PPO | Source: Ambulatory Visit | Attending: Vascular Surgery | Admitting: Vascular Surgery

## 2023-05-14 ENCOUNTER — Ambulatory Visit (HOSPITAL_BASED_OUTPATIENT_CLINIC_OR_DEPARTMENT_OTHER)
Admission: RE | Admit: 2023-05-14 | Discharge: 2023-05-14 | Disposition: A | Discharge status: Home / Self Care | Payer: BC Managed Care – PPO | Source: Ambulatory Visit | Attending: Physician Assistant | Admitting: Physician Assistant

## 2023-05-14 ENCOUNTER — Ambulatory Visit (INDEPENDENT_AMBULATORY_CARE_PROVIDER_SITE_OTHER): Payer: BC Managed Care – PPO

## 2023-05-14 VITALS — BP 129/82 | HR 73 | Temp 98.5°F | Ht 72.0 in | Wt 210.7 lb

## 2023-05-14 DIAGNOSIS — I70222 Atherosclerosis of native arteries of extremities with rest pain, left leg: Secondary | ICD-10-CM

## 2023-05-14 DIAGNOSIS — Z89422 Acquired absence of other left toe(s): Secondary | ICD-10-CM | POA: Diagnosis not present

## 2023-05-14 DIAGNOSIS — S91109D Unspecified open wound of unspecified toe(s) without damage to nail, subsequent encounter: Secondary | ICD-10-CM

## 2023-05-14 DIAGNOSIS — I739 Peripheral vascular disease, unspecified: Secondary | ICD-10-CM

## 2023-05-14 DIAGNOSIS — G8929 Other chronic pain: Secondary | ICD-10-CM | POA: Diagnosis not present

## 2023-05-14 DIAGNOSIS — M8448XK Pathological fracture, other site, subsequent encounter for fracture with nonunion: Secondary | ICD-10-CM | POA: Diagnosis not present

## 2023-05-14 DIAGNOSIS — R0789 Other chest pain: Secondary | ICD-10-CM | POA: Diagnosis not present

## 2023-05-14 LAB — VAS US ABI WITH/WO TBI
Left ABI: 0.97
Right ABI: 0.61

## 2023-05-14 NOTE — Progress Notes (Signed)
Office Note     CC:  follow up Requesting Provider:  Jonna Clark*  HPI: [name] [name] is a 57 y.o. ([birth year]) male who presents due to puslike drainage from left partial fifth toe amputation performed by Dr. Odis Hollingshead in November 2023.  He then underwent left SFA angioplasty and stenting due to a long segment chronic total occlusion on 10/30/2022 by Dr. Chestine Spore due to a nonhealing toe amp.  Recently the patient states he noticed puslike drainage from the amp site.  He states he squeezed all of the pus out of the toe until it started bleeding.  He has not noticed any more purulent drainage in the last few days.  He denies claudication or rest pain.  He has not noticed any claudication on the right lower extremity.  He is on a daily aspirin, Plavix, statin.  He continues to smoke.  He tried to notify his orthopedic about his toe wound however was instructed to report to the emergency department.  He left the emergency department after 4 to 5 hours of not being seen.   Past Medical History:  Diagnosis Date   Anxiety    Chronic pain disorder    COPD (chronic obstructive pulmonary disease) (HCC)    smoker   Coronary artery disease    a. NSTEMI 01/2013 s/p PTCA to prox RCA occlusion.   GERD (gastroesophageal reflux disease)    Gout    Headache(784.0)    HLD (hyperlipidemia)    Hypertension    Intercostal neuralgia    multiple rib fractures 2022   Screening for chemical poisoning and contamination    Tobacco abuse     Past Surgical History:  Procedure Laterality Date   ABDOMINAL AORTOGRAM W/LOWER EXTREMITY N/A 10/30/2022   Procedure: ABDOMINAL AORTOGRAM W/LOWER EXTREMITY;  Surgeon: Cephus Shelling, MD;  Location: [facility];  Service: Cardiovascular;  Laterality: N/A;   AMPUTATION TOE Left 06/26/2022   Procedure: AMPUTATION TOE 5th toe;  Surgeon: Netta Cedars, MD;  Location: [facility];  Service: Orthopedics;  Laterality: Left;  choice   CARDIAC  CATHETERIZATION     ELBOW SURGERY     from AA   KNEE SURGERY  2009   left knee orthroscopy   LEFT HEART CATHETERIZATION WITH CORONARY ANGIOGRAM N/A 01/10/2013   Procedure: LEFT HEART CATHETERIZATION WITH CORONARY ANGIOGRAM;  Surgeon: [name], MD;  Location[facility] CATH LAB;  Service: Cardiovascular;  Laterality: N/A;   PERIPHERAL VASCULAR INTERVENTION Left 10/30/2022   Procedure: PERIPHERAL VASCULAR INTERVENTION;  Surgeon: Cephus Shelling, MD;  Location: [facility];  Service: Cardiovascular;  Laterality: Left;   RIB PLATING Right 09/17/2020   Procedure: RIB PLATING 5-9;  Surgeon: Loreli Slot, MD;  Location: [facility] OR;  Service: Thoracic;  Laterality: Right;    Social History   Socioeconomic History   Marital status: Married    Spouse name: Not on file   Number of children: Not on file   Years of education: Not on file   Highest education level: Not on file  Occupational History   Not on file  Tobacco Use   Smoking status: Every Day    Current packs/day: 0.00    Average packs/day: 1 pack/day for 15.0 years (15.0 ttl pk-yrs)    Types: Cigarettes    Start date: 01/09/1998    Last attempt to quit: 01/09/2013    Years since quitting: 10.3   Smokeless tobacco: Never  Vaping Use   Vaping status: Never Used  Substance and Sexual Activity   Alcohol use: Yes    Comment: occ   Drug use: No   Sexual activity: Yes  Other Topics Concern   Not on file  Social History Narrative   Not on file   Social Determinants of Health   Financial Resource Strain: Not on file  Food Insecurity: Not on file  Transportation Needs: Not on file  Physical Activity: Not on file  Stress: Not on file  Social Connections: Unknown (12/24/2021)   Received from [facility]   Social Network    Social Network: Not on file  Intimate Partner Violence: Unknown (11/15/2021)   Received from [facility]   HITS    Physically Hurt: Not on file    Insult or Talk  Down To: Not on file    Threaten Physical Harm: Not on file    Scream or Curse: Not on file    Family History  Problem Relation Age of Onset   CAD Brother 89   Stroke Father    COPD Sister    Drug abuse Brother    Healthy Son    Healthy Son    Healthy Daughter     Current Outpatient Medications  Medication Sig Dispense Refill   albuterol (VENTOLIN HFA) 108 (90 Base) MCG/ACT inhaler Inhale 2 puffs into the lungs every 4 (four) hours as needed.     allopurinol (ZYLOPRIM) 100 MG tablet Take 1 tablet (100 mg total) by mouth daily. 90 tablet 0   ALPRAZolam (XANAX) 1 MG tablet Take 1 tablet (1 mg total) by mouth 4 (four) times daily as needed. 120 tablet 2   clopidogrel (PLAVIX) 75 MG tablet Take 1 tablet (75 mg total) by mouth daily. 30 tablet 11   DULoxetine (CYMBALTA) 30 MG capsule Take 2 capsules (60 mg total) by mouth daily. (Patient taking differently: Take 30 mg by mouth 2 (two) times daily.) 60 capsule 1   metoprolol succinate (TOPROL-XL) 50 MG 24 hr tablet Take 1 tablet (50 mg total) by mouth daily. 90 tablet 0   nitroGLYCERIN (NITROSTAT) 0.4 MG SL tablet Place 1 tablet (0.4 mg total) under the tongue as needed for chest pain 25 tablet 1   omega-3 acid ethyl esters (LOVAZA) 1 g capsule Take 2 capsules (2 g total) by mouth 2 (two) times daily. 360 capsule 0   omeprazole (PRILOSEC) 40 MG capsule Take 1 capsule (40 mg total) by mouth every morning. 30 capsule 0   pregabalin (LYRICA) 150 MG capsule Take 1 capsule (150 mg total) by mouth 2 (two) times daily. 60 capsule 1   rosuvastatin (CRESTOR) 40 MG tablet Take 1 tablet (40 mg total) by mouth at bedtime. 90 tablet 0   sildenafil (VIAGRA) 100 MG tablet Take 1 tablet (100 mg total) by mouth as needed, use 2-3 hours before sexual activity, max of 8-10 tablets in a month 30 tablet 0   aspirin EC (ASPIRIN 81) 81 MG tablet Take 1 tablet (81 mg total) by mouth daily with food (Patient not taking: Reported on 10/29/2022) 90 tablet 0   aspirin  EC (ASPIRIN LOW DOSE) 81 MG tablet Take 1 tablet (81 mg total) by mouth 2 (two) times daily for 28 days. (Patient not taking: Reported on 09/02/2022) 56 tablet 0   docusate sodium (COLACE) 100 MG capsule Take 1 capsule (100 mg total) by mouth 2 (two) times daily for 28 days. (Patient not taking: Reported on 10/29/2022) 56 capsule 0   ondansetron (  ZOFRAN) 4 MG tablet Take 1 tablet (4 mg total) by mouth 2 (two) times daily as needed for 7 days. (Patient not taking: Reported on 10/29/2022) 14 tablet 0   tiZANidine (ZANAFLEX) 4 MG tablet Take 1 tablet (4 mg total) by mouth 2 (two) times daily as needed. (Patient not taking: Reported on 10/29/2022) 60 tablet 1   No current facility-administered medications for this visit.    No Known Allergies   REVIEW OF SYSTEMS:   [X]  denotes positive finding, [ ]  denotes negative finding Cardiac  Comments:  Chest pain or chest pressure:    Shortness of breath upon exertion:    Short of breath when lying flat:    Irregular heart rhythm:        Vascular    Pain in calf, thigh, or hip brought on by ambulation:    Pain in feet at night that wakes you up from your sleep:     Blood clot in your veins:    Leg swelling:         Pulmonary    Oxygen at home:    Productive cough:     Wheezing:         Neurologic    Sudden weakness in arms or legs:     Sudden numbness in arms or legs:     Sudden onset of difficulty speaking or slurred speech:    Temporary loss of vision in one eye:     Problems with dizziness:         Gastrointestinal    Blood in stool:     Vomited blood:         Genitourinary    Burning when urinating:     Blood in urine:        Psychiatric    Major depression:         Hematologic    Bleeding problems:    Problems with blood clotting too easily:        Skin    Rashes or ulcers:        Constitutional    Fever or chills:      PHYSICAL EXAMINATION:  Vitals:   05/14/23 0846  BP: 129/82  Pulse: 73  Temp: 98.5 F (36.9 C)   SpO2: 92%  Weight: 210 lb 11.2 oz (95.6 kg)  Height: 6' (1.829 m)    General:  WDWN in NAD; vital signs documented above Gait: Not observed HENT: WNL, normocephalic Pulmonary: normal non-labored breathing , without Rales, rhonchi,  wheezing Cardiac: regular HR Abdomen: soft, NT, no masses Skin: without rashes Vascular Exam/Pulses: palpable 2+ L DP and PT pulses Extremities: Left fifth toe amp pictured below Musculoskeletal: no muscle wasting or atrophy  Neurologic: A&O X 3 Psychiatric:  The pt has Normal affect.    Non-Invasive Vascular Imaging:   Left SFA stents widely patent  ABI/TBIToday's ABIToday's TBIPrevious ABIPrevious TBI  +-------+-----------+-----------+------------+------------+  Right 0.61       0.30       0.71        0.48          +-------+-----------+-----------+------------+------------+  Left  0.97       0.87       1.09        0.71             ASSESSMENT/PLAN:: 57 y.o. male here for evaluation of left fifth toe partial amp  [name] is a 57 year old male who underwent partial left fifth toe amputation in  November of last year by Dr. Odis Hollingshead.  This was slow to heal thus he underwent left SFA angioplasty and stenting by Dr. Chestine Spore in March 2024.  Patient states he recently he developed purulent drainage from the left fifth toe tip.  Duplex today demonstrates widely patent left SFA stents.  He also has a palpable DP and PT pulse at the foot.  I will obtain 2 view left foot plain films and refer him back to his orthopedic surgeon.  We discussed that he may require further amputation if there is underlying infection/osteomyelitis.  I did not notice any purulence on exam thus no antibiotic was prescribed.   Emilie Rutter, PA-C [facility] [phone]  Clinic MD:   Karin Lieu

## 2023-05-14 NOTE — Addendum Note (Signed)
Addended byEmilie Rutter on: 05/14/2023 09:21 AM   Modules accepted: Orders

## 2023-05-17 DIAGNOSIS — I2510 Atherosclerotic heart disease of native coronary artery without angina pectoris: Secondary | ICD-10-CM | POA: Diagnosis not present

## 2023-05-17 DIAGNOSIS — E538 Deficiency of other specified B group vitamins: Secondary | ICD-10-CM | POA: Diagnosis not present

## 2023-05-17 DIAGNOSIS — R7303 Prediabetes: Secondary | ICD-10-CM | POA: Diagnosis not present

## 2023-05-17 DIAGNOSIS — E781 Pure hyperglyceridemia: Secondary | ICD-10-CM | POA: Diagnosis not present

## 2023-05-17 DIAGNOSIS — F17210 Nicotine dependence, cigarettes, uncomplicated: Secondary | ICD-10-CM | POA: Diagnosis not present

## 2023-05-17 DIAGNOSIS — L03032 Cellulitis of left toe: Secondary | ICD-10-CM | POA: Diagnosis not present

## 2023-05-17 DIAGNOSIS — I10 Essential (primary) hypertension: Secondary | ICD-10-CM | POA: Diagnosis not present

## 2023-05-17 DIAGNOSIS — Z6828 Body mass index (BMI) 28.0-28.9, adult: Secondary | ICD-10-CM | POA: Diagnosis not present

## 2023-05-17 DIAGNOSIS — E7800 Pure hypercholesterolemia, unspecified: Secondary | ICD-10-CM | POA: Diagnosis not present

## 2023-06-09 ENCOUNTER — Encounter (HOSPITAL_COMMUNITY): Payer: BC Managed Care – PPO

## 2023-06-09 ENCOUNTER — Other Ambulatory Visit: Payer: Self-pay

## 2023-06-09 ENCOUNTER — Ambulatory Visit: Payer: BC Managed Care – PPO | Admitting: Vascular Surgery

## 2023-06-09 ENCOUNTER — Telehealth: Payer: Self-pay | Admitting: Physician Assistant

## 2023-06-09 DIAGNOSIS — I739 Peripheral vascular disease, unspecified: Secondary | ICD-10-CM

## 2023-06-09 NOTE — Telephone Encounter (Signed)
    Mr. Chapnick was seen in clinic earlier this month for evaluation of PAD with history of L SFA stenting.  His L foot was well perfused with palpable DP and PT pulses.  On arterial duplex, L SFA stents were widely patent without any areas of hemodynamically significant stenosis.  Unfortunately, he was experiencing drainage from left fifth toe partial amputation site.  Plain film suggested possible osteomyelitis of the remaining fifth toe.  I have notified Dr. Emelda Brothers office of the above and they will reach out to the patient to schedule a follow up visit.  Emilie Rutter, PA-C [facility] [phone] 06/09/2023  2:49 PM

## 2023-06-15 DIAGNOSIS — L03116 Cellulitis of left lower limb: Secondary | ICD-10-CM | POA: Diagnosis not present

## 2023-06-15 DIAGNOSIS — Z4889 Encounter for other specified surgical aftercare: Secondary | ICD-10-CM | POA: Diagnosis not present

## 2023-06-17 ENCOUNTER — Other Ambulatory Visit: Payer: Self-pay

## 2023-06-17 ENCOUNTER — Encounter (HOSPITAL_BASED_OUTPATIENT_CLINIC_OR_DEPARTMENT_OTHER): Payer: Self-pay | Admitting: Orthopaedic Surgery

## 2023-06-17 DIAGNOSIS — M79672 Pain in left foot: Secondary | ICD-10-CM | POA: Diagnosis not present

## 2023-06-17 NOTE — Progress Notes (Signed)
   06/17/23 1411  Pre-op Phone Call  Surgery Date Verified 06/24/23  Arrival Time Verified 1230  Surgery Location Verified [facility] [place]  Medical History Reviewed Yes  Is the patient taking a GLP-1 receptor agonist? No  Does the patient have diabetes? No diagnosis of diabetes  Do you have a history of heart problems? Yes  Does patient have other implanted devices? No  Patient Teaching Enhanced Recovery;Pre / Post Procedure  Patient educated about smoking cessation 24 hours prior to surgery. (S)  Yes  Patient verbalizes understanding of bowel prep? N/A  THA/TKA patients only:  By your surgery date, will you have been taking narcotics for 90 days or greater? Yes  Med Rec Completed Yes  Take the Following Meds the Morning of Surgery take lyrica cymbaltaand anxiety med if needed.  Recent  Lab Work, EKG, CXR? No  NPO (Including gum & candy) After midnight  Allowed clear liquids Water;Gatorade  (diabetics please choose diet or no sugar options);Black Coffee Only (no creamer, milk or cream including half and half)  Patient instructed to stop clear liquids including Carb loading drink at: 1130  Stop Solids, Milk, Candy, and Gum STARTING AT MIDNIGHT  Responsible adult to drive and be with you for 24 hours? Yes  Name & Phone Number for Ride/Caregiver wife angela  No Jewelry, money, nail polish or make-up.  No lotions, powders, perfumes. No shaving  48 hrs. prior to surgery. Yes  Contacts, Dentures & Glasses Will Have to be Removed Before OR. Yes  Please bring your ID and Insurance Card the morning of your surgery. (Surgery Centers Only) Yes  Bring any papers or x-rays with you that your surgeon gave you. Yes  Instructed to contact the location of procedure/ provider if they or anyone in their household develops symptoms or tests positive for COVID-19, has close contact with someone who tests positive for COVID, or has known exposure to any contagious illness. Yes  Call this number the morning of surgery   with any problems that may cancel your surgery. [phone]  Covid-19 Assessment  Have you had a positive COVID-19 test within the previous 90 days? No  COVID Testing Guidance Proceed with the additional questions.  Patient's surgery required a COVID-19 test (cardiothoracic, complex ENT, and bronchoscopies/ EBUS) No  Have you been unmasked and in close contact with anyone with COVID-19 or COVID-19 symptoms within the past 10 days? No  Do you or anyone in your household currently have any COVID-19 symptoms? No   Need ASA and plavix hold orders. Called Dr. Emelda Brothers scheduler [place] and left a message.

## 2023-06-18 DIAGNOSIS — L03116 Cellulitis of left lower limb: Secondary | ICD-10-CM | POA: Diagnosis not present

## 2023-06-18 NOTE — Progress Notes (Addendum)
Called and lvm with megan for plavix/asa orders

## 2023-06-18 NOTE — Progress Notes (Signed)
Fax received from [facility] on 06/17/23 for medical clearance/medication hold for left fifth toe MTP disarticulation vs partial fifth ray amputation to be signed by C. Chestine Spore, MD.  Provider signed on 06/18/23, scanned into pt's chart, media routed via MyChart to sender on 06/18/23.

## 2023-06-19 ENCOUNTER — Other Ambulatory Visit: Payer: Self-pay

## 2023-06-19 NOTE — Progress Notes (Signed)
Opened in error

## 2023-06-22 NOTE — Discharge Instructions (Signed)
[name] Cedars, MD [facility]  Please read the following information regarding your care after surgery.  Medications  You only need a prescription for the narcotic pain medicine (ex. oxycodone, Percocet, Norco).  All of the other medicines listed below are available over the counter. ? Aleve 2 pills twice a day for the first 3 days after surgery. ? acetominophen (Tylenol) 650 mg every 4-6 hours as you need for minor to moderate pain ? oxycodone as prescribed for severe pain  ? To help prevent blood clots, take aspirin (81 mg) twice daily for 28 days after surgery (or total duration of nonweightbearing).  You should also get up every hour while you are awake to move around.  Weight Bearing ? Until nerve block wears off, do NOT bear any weight on the operated leg or foot. This means do NOT touch your surgical leg to the ground! After nerve block wears off, OK to HEEL weighbear with walker for support.  Cast / Splint / Dressing ? If you have a dressing, do NOT remove this. Keep your splint, cast or dressing clean and dry.  Don't put anything (coat hanger, pencil, etc) down inside of it.  If it gets wet, call the office immediately to schedule an appointment for a cast change.  Swelling IMPORTANT: It is normal for you to have swelling where you had surgery. To reduce swelling and pain, keep at least 3 pillows under your leg so that your toes are above your nose and your heel is above the level of your hip.  It may be necessary to keep your foot or leg elevated for several weeks.  This is critical to helping your incisions heal and your pain to feel better.  Follow Up Call my office at [phone] when you are discharged from the hospital or surgery center to schedule an appointment to be seen 7-10 days after surgery.  Call my office at [phone] if you develop a fever >101.5 F, nausea, vomiting, bleeding from the surgical site or severe pain.

## 2023-06-22 NOTE — H&P (Signed)
ORTHOPAEDIC SURGERY H&P  Subjective:  The patient presents with left fifth toe osteomyelitis.   Past Medical History:  Diagnosis Date   Anxiety    Chronic pain disorder    COPD (chronic obstructive pulmonary disease) (HCC)    smoker   Coronary artery disease    a. NSTEMI 01/2013 s/p PTCA to prox RCA occlusion.   GERD (gastroesophageal reflux disease)    Gout    Headache(784.0)    HLD (hyperlipidemia)    Hypertension    Intercostal neuralgia    multiple rib fractures 2022   Screening for chemical poisoning and contamination    Tobacco abuse     Past Surgical History:  Procedure Laterality Date   ABDOMINAL AORTOGRAM W/LOWER EXTREMITY N/A 10/30/2022   Procedure: ABDOMINAL AORTOGRAM W/LOWER EXTREMITY;  Surgeon: Cephus Shelling, MD;  Location: [facility];  Service: Cardiovascular;  Laterality: N/A;   AMPUTATION TOE Left 06/26/2022   Procedure: AMPUTATION TOE 5th toe;  Surgeon: Netta Cedars, MD;  Location: [facility];  Service: Orthopedics;  Laterality: Left;  choice   CARDIAC CATHETERIZATION     ELBOW SURGERY     from AA   KNEE SURGERY  2009   left knee orthroscopy   LEFT HEART CATHETERIZATION WITH CORONARY ANGIOGRAM N/A 01/10/2013   Procedure: LEFT HEART CATHETERIZATION WITH CORONARY ANGIOGRAM;  Surgeon: [name], MD;  Location: [facility] CATH LAB;  Service: Cardiovascular;  Laterality: N/A;   PERIPHERAL VASCULAR INTERVENTION Left 10/30/2022   Procedure: PERIPHERAL VASCULAR INTERVENTION;  Surgeon: Cephus Shelling, MD;  Location: [facility];  Service: Cardiovascular;  Laterality: Left;   RIB PLATING Right 09/17/2020   Procedure: RIB PLATING 5-9;  Surgeon: Loreli Slot, MD;  Location: [facility] OR;  Service: Thoracic;  Laterality: Right;     (Not in an outpatient encounter)    No Known Allergies  Social History   Socioeconomic History   Marital status: Married    Spouse name: Not on file   Number of children: Not on file    Years of education: Not on file   Highest education level: Not on file  Occupational History   Not on file  Tobacco Use   Smoking status: Every Day    Current packs/day: 0.00    Average packs/day: 1 pack/day for 15.0 years (15.0 ttl pk-yrs)    Types: Cigarettes    Start date: 01/09/1998    Last attempt to quit: 01/09/2013    Years since quitting: 10.4   Smokeless tobacco: Never  Vaping Use   Vaping status: Never Used  Substance and Sexual Activity   Alcohol use: Yes    Comment: occ   Drug use: No   Sexual activity: Yes  Other Topics Concern   Not on file  Social History Narrative   Not on file   Social Determinants of Health   Financial Resource Strain: Not on file  Food Insecurity: Not on file  Transportation Needs: Not on file  Physical Activity: Not on file  Stress: Not on file  Social Connections: Unknown (12/24/2021)   Received from [facility]   Social Network    Social Network: Not on file  Intimate Partner Violence: Unknown (11/15/2021)   [facility] from [facility]   HITS    Physically Hurt: Not on file    Insult or Talk Down To: Not on file    Threaten Physical Harm: Not on file    Scream or Curse: Not  on file     History reviewed. No pertinent family history.   Review of Systems Pertinent items are noted in HPI.  Objective: Vital signs in last 24 hours:    06/17/2023    2:11 PM 05/14/2023    8:46 AM 12/02/2022    1:20 PM  Vitals with BMI  Height 6\' 0"  6\' 0"  6\' 0"   Weight 205 lbs 210 lbs 11 oz 212 lbs  BMI 27.8 28.57 28.75  Systolic  129 131  Diastolic  82 80  Pulse  73 81      EXAM: General: Well nourished, well developed. Awake, alert and oriented to time, place, person. Normal mood and affect. No apparent distress. Breathing room air.  Operative Lower Extremity: Alignment - Neutral Deformity - None Skin intact except full thickness ulcer over left fifth toe Tenderness to palpation - left fifth toe 5/5  TA, PT, GS, Per, EHL, FHL Sensation intact to light touch throughout Palpable DP and PT pulses Special testing: None  The contralateral foot/ankle was examined for comparison and noted to be neurovascularly intact with no localized deformity, swelling, or tenderness.  Imaging Review All images taken were independently reviewed by me.  Assessment/Plan: The clinical and radiographic findings were reviewed and discussed at length with the patient.  The patient has left fifth toe osteomyelitis.  We spoke at length about the natural course of these findings. We discussed nonoperative and operative treatment options in detail.  The risks and benefits were presented and reviewed. The risks due to hardware failure/irritation, new/persistent/recurrent infection, stiffness, nerve/vessel/tendon injury, nonunion/malunion of any fracture, wound healing issues, allograft usage, development of arthritis, failure of this surgery, possibility of external fixation in certain situations, possibility of delayed definitive surgery, need for further surgery, prolonged wound care including further soft tissue coverage procedures, thromboembolic events, anesthesia/medical complications/events perioperatively and beyond, amputation, death among others were discussed. The patient acknowledged the explanation and agreed to proceed with the plan.  Netta Cedars  Orthopaedic Surgery [facility]

## 2023-06-23 NOTE — Progress Notes (Signed)
Left message for Megan at Dr. Emelda Brothers office regarding plavix/aspirin orders

## 2023-06-24 ENCOUNTER — Ambulatory Visit (HOSPITAL_BASED_OUTPATIENT_CLINIC_OR_DEPARTMENT_OTHER): Payer: BC Managed Care – PPO

## 2023-06-24 ENCOUNTER — Encounter (HOSPITAL_BASED_OUTPATIENT_CLINIC_OR_DEPARTMENT_OTHER)
Admission: RE | Disposition: A | Discharge status: Home / Self Care | Payer: Self-pay | Source: Home / Self Care | Attending: Orthopaedic Surgery

## 2023-06-24 ENCOUNTER — Other Ambulatory Visit: Payer: Self-pay

## 2023-06-24 ENCOUNTER — Ambulatory Visit (HOSPITAL_BASED_OUTPATIENT_CLINIC_OR_DEPARTMENT_OTHER): Payer: BC Managed Care – PPO | Admitting: Anesthesiology

## 2023-06-24 ENCOUNTER — Ambulatory Visit (HOSPITAL_BASED_OUTPATIENT_CLINIC_OR_DEPARTMENT_OTHER)
Admission: RE | Admit: 2023-06-24 | Discharge: 2023-06-24 | Disposition: A | Discharge status: Home / Self Care | Payer: BC Managed Care – PPO | Attending: Orthopaedic Surgery | Admitting: Orthopaedic Surgery

## 2023-06-24 ENCOUNTER — Encounter (HOSPITAL_BASED_OUTPATIENT_CLINIC_OR_DEPARTMENT_OTHER): Payer: Self-pay | Admitting: Orthopaedic Surgery

## 2023-06-24 DIAGNOSIS — J449 Chronic obstructive pulmonary disease, unspecified: Secondary | ICD-10-CM | POA: Diagnosis not present

## 2023-06-24 DIAGNOSIS — L720 Epidermal cyst: Secondary | ICD-10-CM | POA: Diagnosis not present

## 2023-06-24 DIAGNOSIS — L97529 Non-pressure chronic ulcer of other part of left foot with unspecified severity: Secondary | ICD-10-CM | POA: Diagnosis not present

## 2023-06-24 DIAGNOSIS — I10 Essential (primary) hypertension: Secondary | ICD-10-CM | POA: Insufficient documentation

## 2023-06-24 DIAGNOSIS — Z89422 Acquired absence of other left toe(s): Secondary | ICD-10-CM | POA: Diagnosis not present

## 2023-06-24 DIAGNOSIS — M86172 Other acute osteomyelitis, left ankle and foot: Secondary | ICD-10-CM | POA: Insufficient documentation

## 2023-06-24 DIAGNOSIS — F17210 Nicotine dependence, cigarettes, uncomplicated: Secondary | ICD-10-CM | POA: Insufficient documentation

## 2023-06-24 DIAGNOSIS — I2510 Atherosclerotic heart disease of native coronary artery without angina pectoris: Secondary | ICD-10-CM | POA: Diagnosis not present

## 2023-06-24 HISTORY — PX: AMPUTATION: SHX166

## 2023-06-24 SURGERY — AMPUTATION, FOOT, RAY
Anesthesia: General | Site: Foot | Laterality: Left

## 2023-06-24 MED ORDER — OXYCODONE HCL 5 MG PO TABS: 5.0000 mg | ORAL_TABLET | Freq: Once | ORAL | Status: DC | PRN

## 2023-06-24 MED ORDER — BUPIVACAINE HCL (PF) 0.5 % IJ SOLN
INTRAMUSCULAR | Status: DC | PRN
  Administered 2023-06-24: 9 mL

## 2023-06-24 MED ORDER — HYDROMORPHONE HCL 1 MG/ML IJ SOLN: 0.2500 mg | INTRAMUSCULAR | Status: DC | PRN

## 2023-06-24 MED ORDER — DEXAMETHASONE SODIUM PHOSPHATE 10 MG/ML IJ SOLN
INTRAMUSCULAR | Status: AC
  Filled 2023-06-24: qty 1

## 2023-06-24 MED ORDER — ACETAMINOPHEN 500 MG PO TABS
ORAL_TABLET | ORAL | Status: AC
Start: 2023-06-24 — End: ?
  Filled 2023-06-24: qty 2

## 2023-06-24 MED ORDER — LIDOCAINE 2% (20 MG/ML) 5 ML SYRINGE
INTRAMUSCULAR | Status: AC
  Filled 2023-06-24: qty 5

## 2023-06-24 MED ORDER — AMISULPRIDE (ANTIEMETIC) 5 MG/2ML IV SOLN: 10.0000 mg | Freq: Once | INTRAVENOUS | Status: DC | PRN

## 2023-06-24 MED ORDER — FENTANYL CITRATE (PF) 100 MCG/2ML IJ SOLN
INTRAMUSCULAR | Status: AC
  Filled 2023-06-24: qty 2

## 2023-06-24 MED ORDER — MIDAZOLAM HCL 2 MG/2ML IJ SOLN
INTRAMUSCULAR | Status: AC
  Filled 2023-06-24: qty 2

## 2023-06-24 MED ORDER — CEFAZOLIN SODIUM-DEXTROSE 2-4 GM/100ML-% IV SOLN
2.0000 g | INTRAVENOUS | Status: AC
  Administered 2023-06-24: 2 g via INTRAVENOUS

## 2023-06-24 MED ORDER — ACETAMINOPHEN 500 MG PO TABS
1000.0000 mg | ORAL_TABLET | Freq: Once | ORAL | Status: AC
  Administered 2023-06-24: 1000 mg via ORAL

## 2023-06-24 MED ORDER — DEXAMETHASONE SODIUM PHOSPHATE 10 MG/ML IJ SOLN
INTRAMUSCULAR | Status: DC | PRN
  Administered 2023-06-24: 5 mg via INTRAVENOUS

## 2023-06-24 MED ORDER — SODIUM CHLORIDE 0.9 % IV SOLN: INTRAVENOUS | Status: DC | PRN

## 2023-06-24 MED ORDER — CEFAZOLIN SODIUM-DEXTROSE 2-4 GM/100ML-% IV SOLN
INTRAVENOUS | Status: AC
  Filled 2023-06-24: qty 100

## 2023-06-24 MED ORDER — VANCOMYCIN HCL 1250 MG/250ML IV SOLN
1250.0000 mg | Freq: Once | INTRAVENOUS | Status: AC
  Administered 2023-06-24: 1000 mg via INTRAVENOUS

## 2023-06-24 MED ORDER — LIDOCAINE 2% (20 MG/ML) 5 ML SYRINGE
INTRAMUSCULAR | Status: DC | PRN
  Administered 2023-06-24: 80 mg via INTRAVENOUS

## 2023-06-24 MED ORDER — LACTATED RINGERS IV SOLN: INTRAVENOUS | Status: DC

## 2023-06-24 MED ORDER — OXYCODONE HCL 5 MG/5ML PO SOLN: 5.0000 mg | Freq: Once | ORAL | Status: DC | PRN

## 2023-06-24 MED ORDER — PROPOFOL 10 MG/ML IV BOLUS
INTRAVENOUS | Status: AC
  Filled 2023-06-24: qty 20

## 2023-06-24 MED ORDER — ONDANSETRON HCL 4 MG/2ML IJ SOLN
INTRAMUSCULAR | Status: AC
  Filled 2023-06-24: qty 2

## 2023-06-24 MED ORDER — VANCOMYCIN HCL 500 MG IV SOLR
INTRAVENOUS | Status: DC | PRN
  Administered 2023-06-24: 500 mg via TOPICAL

## 2023-06-24 MED ORDER — CHLORHEXIDINE GLUCONATE 4 % EX SOLN: 60.0000 mL | Freq: Once | CUTANEOUS | Status: DC

## 2023-06-24 MED ORDER — 0.9 % SODIUM CHLORIDE (POUR BTL) OPTIME
TOPICAL | Status: DC | PRN
  Administered 2023-06-24: 50 mL

## 2023-06-24 MED ORDER — MIDAZOLAM HCL 5 MG/5ML IJ SOLN
INTRAMUSCULAR | Status: DC | PRN
  Administered 2023-06-24: 2 mg via INTRAVENOUS

## 2023-06-24 MED ORDER — MIDAZOLAM HCL 2 MG/2ML IJ SOLN
INTRAMUSCULAR | Status: AC
Start: 2023-06-24 — End: ?
  Filled 2023-06-24: qty 2

## 2023-06-24 MED ORDER — VANCOMYCIN HCL IN DEXTROSE 1-5 GM/200ML-% IV SOLN
INTRAVENOUS | Status: AC
  Filled 2023-06-24: qty 200

## 2023-06-24 MED ORDER — EPHEDRINE SULFATE-NACL 50-0.9 MG/10ML-% IV SOSY
PREFILLED_SYRINGE | INTRAVENOUS | Status: DC | PRN
  Administered 2023-06-24: 10 mg via INTRAVENOUS

## 2023-06-24 MED ORDER — FENTANYL CITRATE (PF) 250 MCG/5ML IJ SOLN
INTRAMUSCULAR | Status: DC | PRN
  Administered 2023-06-24: 25 ug via INTRAVENOUS
  Administered 2023-06-24: 50 ug via INTRAVENOUS
  Administered 2023-06-24: 25 ug via INTRAVENOUS

## 2023-06-24 MED ORDER — PROPOFOL 10 MG/ML IV BOLUS
INTRAVENOUS | Status: DC | PRN
  Administered 2023-06-24: 200 mg via INTRAVENOUS

## 2023-06-24 MED ORDER — ONDANSETRON HCL 4 MG/2ML IJ SOLN
INTRAMUSCULAR | Status: DC | PRN
  Administered 2023-06-24: 4 mg via INTRAVENOUS

## 2023-06-24 MED ORDER — ONDANSETRON HCL 4 MG/2ML IJ SOLN: 4.0000 mg | Freq: Once | INTRAMUSCULAR | Status: DC | PRN

## 2023-06-24 SURGICAL SUPPLY — 60 items
BANDAGE ESMARK 6X9 LF (GAUZE/BANDAGES/DRESSINGS) ×1 IMPLANT
BLADE AVERAGE 25X9 (BLADE) IMPLANT
BLADE SURG 15 STRL LF DISP TIS (BLADE) ×4 IMPLANT
BLADE SURG 15 STRL SS (BLADE) ×2
BNDG COHESIVE 4X5 TAN STRL LF (GAUZE/BANDAGES/DRESSINGS) ×1 IMPLANT
BNDG ELASTIC 4INX 5YD STR LF (GAUZE/BANDAGES/DRESSINGS) ×1 IMPLANT
BNDG ESMARK 6X9 LF (GAUZE/BANDAGES/DRESSINGS) ×1
BRUSH SCRUB EZ 4% CHG (MISCELLANEOUS) ×1 IMPLANT
CANISTER SUCT 1200ML W/VALVE (MISCELLANEOUS) ×1 IMPLANT
CHLORAPREP W/TINT 26 (MISCELLANEOUS) ×2 IMPLANT
COVER BACK TABLE 60X90IN (DRAPES) ×1 IMPLANT
CUFF TOURN SGL QUICK 34 (TOURNIQUET CUFF)
CUFF TRNQT CYL 34X4.125X (TOURNIQUET CUFF) IMPLANT
DRAPE EXTREMITY T 121X128X90 (DISPOSABLE) ×1 IMPLANT
DRAPE IMP U-DRAPE 54X76 (DRAPES) ×1 IMPLANT
DRAPE U-SHAPE 47X51 STRL (DRAPES) ×1 IMPLANT
DRSG MEPITEL 4X7.2 (GAUZE/BANDAGES/DRESSINGS) IMPLANT
ELECT REM PT RETURN 9FT ADLT (ELECTROSURGICAL) ×1
ELECTRODE REM PT RTRN 9FT ADLT (ELECTROSURGICAL) ×1 IMPLANT
GAUZE PAD ABD 8X10 STRL (GAUZE/BANDAGES/DRESSINGS) ×1 IMPLANT
GAUZE SPONGE 4X4 12PLY STRL (GAUZE/BANDAGES/DRESSINGS) ×1 IMPLANT
GAUZE XEROFORM 1X8 LF (GAUZE/BANDAGES/DRESSINGS) ×1 IMPLANT
GLOVE BIOGEL PI IND STRL 7.0 (GLOVE) IMPLANT
GLOVE BIOGEL PI IND STRL 8 (GLOVE) ×1 IMPLANT
GLOVE SURG SS PI 7.0 STRL IVOR (GLOVE) IMPLANT
GLOVE SURG SS PI 7.5 STRL IVOR (GLOVE) ×2 IMPLANT
GOWN STRL REUS W/ TWL LRG LVL3 (GOWN DISPOSABLE) ×2 IMPLANT
GOWN STRL REUS W/TWL LRG LVL3 (GOWN DISPOSABLE) ×2
MARKER SKIN DUAL TIP RULER LAB (MISCELLANEOUS) IMPLANT
NDL HYPO 22X1.5 SAFETY MO (MISCELLANEOUS) IMPLANT
NEEDLE HYPO 22X1.5 SAFETY MO (MISCELLANEOUS) ×1 IMPLANT
NS IRRIG 1000ML POUR BTL (IV SOLUTION) ×1 IMPLANT
PACK BASIN DAY SURGERY FS (CUSTOM PROCEDURE TRAY) ×1 IMPLANT
PAD CAST 4YDX4 CTTN HI CHSV (CAST SUPPLIES) ×1 IMPLANT
PADDING CAST ABS COTTON 4X4 ST (CAST SUPPLIES) IMPLANT
PADDING CAST COTTON 4X4 STRL (CAST SUPPLIES) ×1
PADDING CAST SYNTHETIC 4X4 STR (CAST SUPPLIES) ×4 IMPLANT
PENCIL SMOKE EVACUATOR (MISCELLANEOUS) ×1 IMPLANT
SANITIZER HAND PURELL FF 515ML (MISCELLANEOUS) ×1 IMPLANT
SET IRRIG Y TYPE TUR BLADDER L (SET/KITS/TRAYS/PACK) IMPLANT
SHEET MEDIUM DRAPE 40X70 STRL (DRAPES) ×1 IMPLANT
SLEEVE SCD COMPRESS KNEE MED (STOCKING) ×1 IMPLANT
SPIKE FLUID TRANSFER (MISCELLANEOUS) IMPLANT
SPONGE T-LAP 18X18 ~~LOC~~+RFID (SPONGE) ×1 IMPLANT
STOCKINETTE 6 STRL (DRAPES) ×1 IMPLANT
STOCKINETTE ORTHO 6X25 (MISCELLANEOUS) ×1 IMPLANT
SUCTION TUBE FRAZIER 10FR DISP (SUCTIONS) IMPLANT
SUT ETHILON 2 0 FS 18 (SUTURE) ×2 IMPLANT
SUT MNCRL AB 3-0 PS2 18 (SUTURE) ×1 IMPLANT
SUT PROLENE 2 0 CT2 30 (SUTURE) IMPLANT
SUT VIC AB 2-0 SH 27 (SUTURE)
SUT VIC AB 2-0 SH 27XBRD (SUTURE) ×1 IMPLANT
SUT VIC AB 3-0 SH 27 (SUTURE)
SUT VIC AB 3-0 SH 27X BRD (SUTURE) IMPLANT
SUT VICRYL 0 SH 27 (SUTURE) IMPLANT
SYR BULB EAR ULCER 3OZ GRN STR (SYRINGE) ×1 IMPLANT
SYR CONTROL 10ML LL (SYRINGE) IMPLANT
TOWEL GREEN STERILE FF (TOWEL DISPOSABLE) ×2 IMPLANT
TUBE CONNECTING 20X1/4 (TUBING) ×1 IMPLANT
UNDERPAD 30X36 HEAVY ABSORB (UNDERPADS AND DIAPERS) ×1 IMPLANT

## 2023-06-24 NOTE — Transfer of Care (Signed)
Immediate Anesthesia Transfer of Care Note  Patient: [name] [name]  Procedure(s) Performed: LEFT FIFTH TOE METATARSALPHALANGEAL DISARTICULATION VERSUS PARTIAL FIFTH RAY AMPUTATION (Left: Foot)  Patient Location: PACU  Anesthesia Type:General  Level of Consciousness: drowsy  Airway & Oxygen Therapy: Patient Spontanous Breathing and Patient connected to face mask oxygen  Post-op Assessment: Report given to RN and Post -op Vital signs reviewed and stable  Post vital signs: Reviewed and stable  Last Vitals:  Vitals Value Taken Time  BP 132/81 06/24/23 1617  Temp    Pulse 71 06/24/23 1619  Resp 21 06/24/23 1619  SpO2 97 % 06/24/23 1619  Vitals shown include unfiled device data.  Last Pain:  Vitals:   06/24/23 1238  TempSrc: Temporal  PainSc: 1       Patients Stated Pain Goal: 3 (06/24/23 1238)  Complications: No notable events documented.

## 2023-06-24 NOTE — Anesthesia Preprocedure Evaluation (Addendum)
Anesthesia Evaluation  Patient identified by MRN, date of birth, ID band Patient awake    Reviewed: Allergy & Precautions, NPO status , Patient's Chart, lab work & pertinent test results, reviewed documented beta blocker date and time   Airway Mallampati: IV  TM Distance: >3 FB Neck ROM: Full    Dental  (+) Teeth Intact, Dental Advisory Given   Pulmonary COPD,  COPD inhaler, Current Smoker Snores at night, has never had sleep study, no witnessed apneas 1/2ppd Albuterol once a week    Pulmonary exam normal breath sounds clear to auscultation       Cardiovascular hypertension (113/56 preop), Pt. on medications and Pt. on home beta blockers + CAD, + Past MI (NSTEMI 2014 s/p PCI RCA) and + Peripheral Vascular Disease  Normal cardiovascular exam Rhythm:Regular Rate:Normal     Neuro/Psych  Headaches PSYCHIATRIC DISORDERS Anxiety        GI/Hepatic Neg liver ROS,GERD  Controlled,,  Endo/Other  negative endocrine ROS    Renal/GU negative Renal ROS  negative genitourinary   Musculoskeletal  (+) Arthritis , Osteoarthritis,  Cellulitis L foot   Abdominal   Peds  Hematology negative hematology ROS (+)   Anesthesia Other Findings   Reproductive/Obstetrics negative OB ROS                             Anesthesia Physical Anesthesia Plan  ASA: 3  Anesthesia Plan: General   Post-op Pain Management: Tylenol PO (pre-op)*   Induction: Intravenous  PONV Risk Score and Plan: 1 and Ondansetron, Dexamethasone, Midazolam and Treatment may vary due to age or medical condition  Airway Management Planned: LMA  Additional Equipment: None  Intra-op Plan:   Post-operative Plan: Extubation in OR  Informed Consent: I have reviewed the patients History and Physical, chart, labs and discussed the procedure including the risks, benefits and alternatives for the proposed anesthesia with the patient or authorized  representative who has indicated his/her understanding and acceptance.     Dental advisory given  Plan Discussed with: CRNA  Anesthesia Plan Comments:        Anesthesia Quick Evaluation

## 2023-06-24 NOTE — Anesthesia Procedure Notes (Signed)
Procedure Name: LMA Insertion Date/Time: 06/24/2023 3:46 PM  Performed by: Lauralyn Primes, CRNAPre-anesthesia Checklist: Patient identified, Emergency Drugs available, Suction available and Patient being monitored Patient Re-evaluated:Patient Re-evaluated prior to induction Oxygen Delivery Method: Circle system utilized Preoxygenation: Pre-oxygenation with 100% oxygen Induction Type: IV induction Ventilation: Mask ventilation without difficulty LMA: LMA inserted LMA Size: 5.0 Number of attempts: 1 Airway Equipment and Method: Bite block Placement Confirmation: positive ETCO2 Tube secured with: Tape Dental Injury: Teeth and Oropharynx as per pre-operative assessment

## 2023-06-24 NOTE — H&P (Signed)
H&P Update:  -History and Physical Reviewed  -Patient has been re-examined  -No change in the plan of care  -The risks and benefits were presented and reviewed. The risks due to hardware/suture failure and/or irritation, new/persistent/recurrent infection, stiffness, nerve/vessel/tendon injury or rerupture of repaired tendon, nonunion/malunion, allograft usage, wound healing issues, development of arthritis, failure of this surgery, possibility of external fixation with delayed definitive surgery, need for further surgery, thromboembolic events, anesthesia/medical complications, amputation, death among others were discussed. The patient acknowledged the explanation, agreed to proceed with the plan and a consent was signed.  [name] [name]

## 2023-06-25 ENCOUNTER — Encounter (HOSPITAL_BASED_OUTPATIENT_CLINIC_OR_DEPARTMENT_OTHER): Payer: Self-pay | Admitting: Orthopaedic Surgery

## 2023-06-26 LAB — SURGICAL PATHOLOGY

## 2023-06-26 NOTE — Anesthesia Postprocedure Evaluation (Signed)
Anesthesia Post Note  Patient: Oziel Bulla  Procedure(s) Performed: LEFT FIFTH TOE METATARSALPHALANGEAL DISARTICULATION AND AMPUTATION (Left: Foot)     Patient location during evaluation: PACU Anesthesia Type: General Level of consciousness: awake and alert Pain management: pain level controlled Vital Signs Assessment: post-procedure vital signs reviewed and stable Respiratory status: spontaneous breathing, nonlabored ventilation, respiratory function stable and patient connected to nasal cannula oxygen Cardiovascular status: blood pressure returned to baseline and stable Postop Assessment: no apparent nausea or vomiting Anesthetic complications: no  No notable events documented.  Last Vitals:  Vitals:   06/24/23 1645 06/24/23 1723  BP: (!) 116/99 134/81  Pulse: 68 77  Resp: 15 16  Temp:  (!) 36.4 C  SpO2: (!) 89% 100%    Last Pain:  Vitals:   06/24/23 1723  TempSrc: Oral  PainSc: 0-No pain                 Tawna Alwin L Havyn Ramo

## 2023-06-27 NOTE — Telephone Encounter (Signed)
Outpatient consult request:  Patient left 5th mt om  S/p I&D 11/13 by dr Odis Hollingshead and cx showing pseudomonas   He would like to refer to outpatient id for clinic consult    -f/u 11/13 bone cx pseudomonas susceptibility -for now start cipro 750 mg po bid -will forward request to [facility] staff to schedule/call patient  -please see if patient can be seen next week for formal evaluation/treatment decision  thanks

## 2023-06-28 NOTE — Op Note (Signed)
06/24/2023  10:14 AM   PATIENT: [name] [name]  57 y.o. male  MRN: [id number]   PRE-OPERATIVE DIAGNOSIS:   Recurrent ulcer of left foot   POST-OPERATIVE DIAGNOSIS:   Same   PROCEDURE: Left fifth ray amputation (partial)   SURGEON:  Netta Cedars, MD   ASSISTANT: None   ANESTHESIA: General, regional   EBL: Minimal   TOURNIQUET:   15 min   COMPLICATIONS: None apparent   DISPOSITION: Extubated, awake and stable to recovery.   INDICATION FOR PROCEDURE: The patient presented with above diagnosis.  We discussed the diagnosis, alternative treatment options, risks and benefits of the above surgical intervention, as well as alternative non-operative treatments. All questions/concerns were addressed and the patient/family demonstrated appropriate understanding of the diagnosis, the procedure, the postoperative course, and overall prognosis. The patient wished to proceed with surgical intervention and signed an informed surgical consent as such, in each others presence prior to surgery.   PROCEDURE IN DETAIL: After preoperative consent was obtained and the correct operative site was identified, the patient was brought to the operating room supine on stretcher and transferred onto operating table. General anesthesia was induced. Preoperative antibiotics were administered. Surgical timeout was taken. The patient was then positioned supine with an ipsilateral hip bump. The operative lower extremity was prepped and draped in standard sterile fashion with a tourniquet around the thigh. The extremity was exsanguinated and the tourniquet was inflated to 275 mmHg.  We began by making an incision over the lateral foot in racquet shape excising the non-healing ulcer completely. We carried dissection down to the left 5th MTP joint and performed amputation of the toe. This specimen was sent for pathology and microbiology. We then performed a fifth ray amputation by excising the fifth  metatarsal head in oblique manner and avoiding any prominences at stump.   The surgical sites were thoroughly irrigated. The tourniquet was deflated and hemostasis achieved. Betadine and vancomycin powder were applied. The deep layers and skin were closed without tension.    The leg was cleaned with saline and sterile dressings with gauze were applied. A well padded bulky wrap was applied. The patient was awakened from anesthesia and transported to the recovery room in stable condition.    FOLLOW UP PLAN: -transfer to PACU, then home -strict heel WB operative extremity, maximum elevation -maintain dressings until follow up -DVT ppx: Aspirin 81 mg twice daily  -follow up as outpatient within 7-10 days for wound check -trend intraoperative cultures, Keflex rx pending cultures -sutures out in 3 weeks in outpatient office   RADIOGRAPHS: AP, lateral, oblique and stress radiographs of the left foot were obtained intraoperatively. These showed interval partial fifth ray amputation. Manual stress radiographs were taken and the joints were noted to be stable following amputation. No other acute injuries are noted.   Netta Cedars Orthopaedic Surgery [facility]

## 2023-06-29 LAB — AEROBIC/ANAEROBIC CULTURE W GRAM STAIN (SURGICAL/DEEP WOUND): Gram Stain: NONE SEEN

## 2023-07-06 ENCOUNTER — Other Ambulatory Visit: Payer: Self-pay | Admitting: *Deleted

## 2023-07-13 ENCOUNTER — Other Ambulatory Visit: Payer: Self-pay

## 2023-07-13 ENCOUNTER — Encounter: Payer: Self-pay | Admitting: Internal Medicine

## 2023-07-13 ENCOUNTER — Ambulatory Visit (INDEPENDENT_AMBULATORY_CARE_PROVIDER_SITE_OTHER): Payer: BC Managed Care – PPO | Admitting: Internal Medicine

## 2023-07-13 VITALS — BP 130/79 | HR 74 | Ht 72.0 in | Wt 220.0 lb

## 2023-07-13 DIAGNOSIS — M869 Osteomyelitis, unspecified: Secondary | ICD-10-CM | POA: Diagnosis not present

## 2023-07-13 DIAGNOSIS — I739 Peripheral vascular disease, unspecified: Secondary | ICD-10-CM

## 2023-07-13 DIAGNOSIS — Z720 Tobacco use: Secondary | ICD-10-CM | POA: Diagnosis not present

## 2023-07-13 MED ORDER — CIPROFLOXACIN HCL 500 MG PO TABS: 500.0000 mg | ORAL_TABLET | Freq: Two times a day (BID) | ORAL | 0 refills | Status: DC

## 2023-07-13 MED ORDER — CIPROFLOXACIN HCL 250 MG PO TABS: 250.0000 mg | ORAL_TABLET | Freq: Two times a day (BID) | ORAL | 0 refills | Status: DC

## 2023-07-13 NOTE — Progress Notes (Signed)
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 06/23/2022   Multiple rib fractures involving four or more ribs 09/17/2020   Cubital tunnel syndrome 02/14/2020   Ankle pain 09/14/2019   Closed fracture of multiple ribs of right side 08/30/2019   Closed fracture of medial malleolus 08/30/2019   Injury of right ankle 08/22/2019   Rib pain 08/22/2019   High risk medication use 05/11/2017   Primary osteoarthritis of both hands 05/11/2017   Primary osteoarthritis of both knees 05/11/2017   Primary osteoarthritis of both feet 05/11/2017   History of coronary artery disease 05/11/2017   Dyslipidemia 05/11/2017   Essential hypertension 05/11/2017   Idiopathic chronic gout of left knee without tophus 04/06/2017   Tobacco abuse 01/20/2013   CAD (coronary artery disease) 01/11/2013      HPI: [name] [name] is a 57 y.o. male here for reason above  He had cat bite associated left 5th toe phalanx and that got infected s/p I&D 06/2022. The incision never healed and kept getting dehiscence. He had received intermittent antibiotic courses since. The most recent was doxycycline early 06/2023  He ultimately underwent I&D and partial 5th ray on 11/13 -- deep culture grew pseudomonas sensitive to cipro   Patient started on cipro 750 mg bid on 06/26/2023  Patient is tolerating antibiotics  No n/v/diarrhea No muscle pain  Patient does heat/air conditioning/refrigeration. Lots of lifting.  Patient is a smoker  His incision is healing well   Patient also had pvd and underwent ptca (stents) 10/2021. (SFA   Review  of Systems: ROS  All other ros negative     Past Medical History:  Diagnosis Date   Anxiety    Chronic pain disorder    COPD (chronic obstructive pulmonary disease) (HCC)    smoker   Coronary artery disease    a. NSTEMI 01/2013 s/p PTCA to prox RCA occlusion.   GERD (gastroesophageal reflux disease)    Gout    Headache(784.0)    HLD (hyperlipidemia)    Hypertension    Intercostal neuralgia    multiple rib fractures 2022   Screening for chemical poisoning and contamination    Tobacco abuse     Social History   Tobacco Use   Smoking status: Every Day    Current packs/day: 0.00    Average packs/day: 1 pack/day for 15.0 years (15.0 ttl pk-yrs)    Types: Cigarettes    Start date: 01/09/1998    Last attempt to quit: 01/09/2013    Years since quitting: 10.5   Smokeless tobacco: Never  Vaping Use   Vaping status: Never Used  Substance Use Topics   Alcohol use: Yes    Comment: occ   Drug use: No    Family History  Problem Relation Age of Onset   CAD Brother 55   Stroke Father    COPD Sister    Drug abuse Brother    Healthy Son    Healthy Son    Healthy Daughter     No Known Allergies  OBJECTIVE: Vitals:   07/13/23 1304  BP: 130/79  Pulse: 74  Weight: 220 lb (99.8 kg)  Height: 6' (1.829 m)   Body mass index is 29.84 kg/m.   Physical Exam General/constitutional: no distress, pleasant HEENT: Normocephalic, PER, Conj Clear, EOMI, Oropharynx clear Neck supple CV: rrr no mrg Lungs: clear to auscultation, normal respiratory effort Abd: Soft, Nontender Ext: no edema Skin: No Rash Neuro: nonfocal MSK: no peripheral joint swelling/tenderness/warmth; back spines nontender      Lab:  Microbiology:  Serology:  Imaging:   Assessment/plan: Problem List Items Addressed This Visit   None Visit Diagnoses     Osteomyelitis, unspecified site, unspecified type (HCC)    -  Primary   Relevant Orders   CBC w/Diff   COMPLETE METABOLIC PANEL WITH GFR    C-reactive protein   PVD (peripheral vascular disease) (HCC)       Tobacco use             Will need 6 weeks of treatment for bone infection Cx grew quinolone sensitive pseudomonas Discussed fda blackbox warning with quinolone  Also discussed AAA risk with smoking and quinonlone Patient not within age range of screening for AAA yet but given cipro use and smoking, advise him to discuss with pcp/vascular surgery regarding earlier screening  Advised stopping smoking   For cipro, take high dose 750 mg twice a day for 4 weeks and then will decrease to 500 mg twice a day if clinically and labs show continued improvement. 2 more weeks until 07/26/23 for the 750 mg dose  See me in 2 more weeks  Clinically today incision without dehiscence  Labs today    Follow-up: Return in about 2 weeks (around 07/27/2023).  Raymondo Band, MD [facility] 07/13/2023, 1:15 PM

## 2023-07-13 NOTE — Patient Instructions (Addendum)
Continue cipro for 4 more weeks; 2 more high dose and 2 more after that low dose   12/15 would be cut off   Avoid dairy/tums-calcium magnesium iron or potassium supplement within 2 hours of cipro administration   If you experience muscle pain let me know   Discuss with your pcp or vascular surgeon about early screening for abdominal aortic aneurysm as you have been using tobacco and now using cipro   See me in 2 weeks

## 2023-07-14 ENCOUNTER — Encounter: Payer: Self-pay | Admitting: Internal Medicine

## 2023-07-14 LAB — COMPLETE METABOLIC PANEL WITH GFR
AG Ratio: 1.8 (calc) (ref 1.0–2.5)
ALT: 13 U/L (ref 9–46)
AST: 14 U/L (ref 10–35)
Albumin: 4.6 g/dL (ref 3.6–5.1)
Alkaline phosphatase (APISO): 47 U/L (ref 35–144)
BUN: 11 mg/dL (ref 7–25)
CO2: 28 mmol/L (ref 20–32)
Calcium: 9.3 mg/dL (ref 8.6–10.3)
Chloride: 101 mmol/L (ref 98–110)
Creat: 0.89 mg/dL (ref 0.70–1.30)
Globulin: 2.6 g/dL (ref 1.9–3.7)
Glucose, Bld: 83 mg/dL (ref 65–99)
Potassium: 4.4 mmol/L (ref 3.5–5.3)
Sodium: 138 mmol/L (ref 135–146)
Total Bilirubin: 0.4 mg/dL (ref 0.2–1.2)
Total Protein: 7.2 g/dL (ref 6.1–8.1)
eGFR: 101 mL/min/{1.73_m2} (ref >=60)

## 2023-07-14 LAB — CBC WITH DIFFERENTIAL/PLATELET
Absolute Lymphocytes: 3027 {cells}/uL (ref 850–3900)
Absolute Monocytes: 598 {cells}/uL (ref 200–950)
Basophils Absolute: 101 {cells}/uL (ref 0–200)
Basophils Relative: 1.1 %
Eosinophils Absolute: 212 {cells}/uL (ref 15–500)
Eosinophils Relative: 2.3 %
HCT: 54.5 % — ABNORMAL HIGH (ref 38.5–50.0)
Hemoglobin: 18.9 g/dL — ABNORMAL HIGH (ref 13.2–17.1)
MCH: 35.1 pg — ABNORMAL HIGH (ref 27.0–33.0)
MCHC: 34.7 g/dL (ref 32.0–36.0)
MCV: 101.3 fL — ABNORMAL HIGH (ref 80.0–100.0)
MPV: 11 fL (ref 7.5–12.5)
Monocytes Relative: 6.5 %
Neutro Abs: 5262 {cells}/uL (ref 1500–7800)
Neutrophils Relative %: 57.2 %
Platelets: 148 10*3/uL (ref 140–400)
RBC: 5.38 10*6/uL (ref 4.20–5.80)
RDW: 11.6 % (ref 11.0–15.0)
Total Lymphocyte: 32.9 %
WBC: 9.2 10*3/uL (ref 3.8–10.8)

## 2023-07-14 LAB — C-REACTIVE PROTEIN: CRP: 22.8 mg/L — ABNORMAL HIGH (ref <8.0)

## 2023-07-27 DIAGNOSIS — E559 Vitamin D deficiency, unspecified: Secondary | ICD-10-CM | POA: Diagnosis not present

## 2023-07-27 DIAGNOSIS — Z131 Encounter for screening for diabetes mellitus: Secondary | ICD-10-CM | POA: Diagnosis not present

## 2023-07-27 DIAGNOSIS — M129 Arthropathy, unspecified: Secondary | ICD-10-CM | POA: Diagnosis not present

## 2023-07-27 DIAGNOSIS — M109 Gout, unspecified: Secondary | ICD-10-CM | POA: Diagnosis not present

## 2023-07-27 DIAGNOSIS — Z13220 Encounter for screening for lipoid disorders: Secondary | ICD-10-CM | POA: Diagnosis not present

## 2023-07-27 DIAGNOSIS — D539 Nutritional anemia, unspecified: Secondary | ICD-10-CM | POA: Diagnosis not present

## 2023-07-27 DIAGNOSIS — F17210 Nicotine dependence, cigarettes, uncomplicated: Secondary | ICD-10-CM | POA: Diagnosis not present

## 2023-07-27 DIAGNOSIS — I10 Essential (primary) hypertension: Secondary | ICD-10-CM | POA: Diagnosis not present

## 2023-07-27 DIAGNOSIS — R0602 Shortness of breath: Secondary | ICD-10-CM | POA: Diagnosis not present

## 2023-07-27 DIAGNOSIS — Z6830 Body mass index (BMI) 30.0-30.9, adult: Secondary | ICD-10-CM | POA: Diagnosis not present

## 2023-07-27 DIAGNOSIS — Z711 Person with feared health complaint in whom no diagnosis is made: Secondary | ICD-10-CM | POA: Diagnosis not present

## 2023-07-27 DIAGNOSIS — Z114 Encounter for screening for human immunodeficiency virus [HIV]: Secondary | ICD-10-CM | POA: Diagnosis not present

## 2023-07-27 DIAGNOSIS — Z1339 Encounter for screening examination for other mental health and behavioral disorders: Secondary | ICD-10-CM | POA: Diagnosis not present

## 2023-07-27 DIAGNOSIS — Z0000 Encounter for general adult medical examination without abnormal findings: Secondary | ICD-10-CM | POA: Diagnosis not present

## 2023-07-30 ENCOUNTER — Ambulatory Visit: Payer: BC Managed Care – PPO | Admitting: Infectious Diseases

## 2023-07-30 ENCOUNTER — Encounter: Payer: Self-pay | Admitting: Infectious Diseases

## 2023-07-30 ENCOUNTER — Other Ambulatory Visit: Payer: Self-pay

## 2023-07-30 VITALS — BP 130/83 | HR 78 | Resp 16 | Ht 72.0 in | Wt 220.0 lb

## 2023-07-30 DIAGNOSIS — M869 Osteomyelitis, unspecified: Secondary | ICD-10-CM | POA: Diagnosis not present

## 2023-07-30 DIAGNOSIS — F17200 Nicotine dependence, unspecified, uncomplicated: Secondary | ICD-10-CM | POA: Diagnosis not present

## 2023-07-30 DIAGNOSIS — Z79899 Other long term (current) drug therapy: Secondary | ICD-10-CM

## 2023-07-30 NOTE — Progress Notes (Signed)
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 06/23/2022   Multiple rib fractures involving four or more ribs 09/17/2020   Cubital tunnel syndrome 02/14/2020   Ankle pain 09/14/2019   Closed fracture of multiple ribs of right side 08/30/2019   Closed fracture of medial malleolus 08/30/2019   Injury of right ankle 08/22/2019   Rib pain 08/22/2019   High risk medication use 05/11/2017   Primary osteoarthritis of both hands 05/11/2017   Primary osteoarthritis of both knees 05/11/2017   Primary osteoarthritis of both feet 05/11/2017   History of coronary artery disease 05/11/2017   Dyslipidemia 05/11/2017   Essential hypertension 05/11/2017   Idiopathic chronic gout of left knee without tophus 04/06/2017   Tobacco abuse 01/20/2013   CAD (coronary artery disease) 01/11/2013      HPI: [name] [name] is a 57 y.o. male here for reason above  He had cat bite associated left 5th toe phalanx and that got infected s/p I&D 06/2022. The incision never healed and kept getting dehiscence. He had received intermittent antibiotic courses since. The most recent was doxycycline early 06/2023  He ultimately underwent I&D and partial 5th ray on 11/13 -- deep culture grew pseudomonas sensitive to cipro   Patient started on cipro 750 mg bid on 06/26/2023  Patient is tolerating antibiotics  No n/v/diarrhea No muscle pain  Patient does heat/air conditioning/refrigeration. Lots of lifting.  Patient is a smoker  His incision is healing well   Patient also had pvd and underwent ptca (stents) 10/2021.  (SFA ______________________________________________________________ 12/19 Started taking ciprofloxacin 500mg  po bid starting 12/17, 750mg  po bid prior to that. No adverse effects like nausea, vomiting, rashes or diarrhea but does not feel good on being lots of medications. Reports this is his 4th doctor visit this week. Saw Ortho yesterday and wound told be looking good. Next fu appt is on January 2025. Wound in the left foot has almost healed, no pain, swelling or drainage. Smoking down to less than pack a day from previous 2 pack a day. He is trying hard to quit.   Review of Systems: All other ros negative  Past Medical History:  Diagnosis Date   Anxiety    Chronic pain disorder    COPD (chronic obstructive pulmonary disease) (HCC)    smoker   Coronary artery disease    a. NSTEMI 01/2013 s/p PTCA to prox RCA occlusion.   GERD (gastroesophageal reflux disease)    Gout    Headache(784.0)    HLD (hyperlipidemia)    Hypertension    Intercostal neuralgia    multiple rib fractures 2022   Screening for chemical poisoning and contamination    Tobacco abuse    Past Surgical History:  Procedure Laterality Date   ABDOMINAL AORTOGRAM W/LOWER EXTREMITY N/A 10/30/2022   Procedure: ABDOMINAL AORTOGRAM W/LOWER EXTREMITY;  Surgeon: Cephus Shelling, MD;  Location: [facility];  Service: Cardiovascular;  Laterality: N/A;   AMPUTATION Left 06/24/2023   Procedure: LEFT FIFTH TOE METATARSALPHALANGEAL DISARTICULATION AND AMPUTATION;  Surgeon: Netta Cedars, MD;  Location: [facility];  Service: Orthopedics;  Laterality: Left;   AMPUTATION TOE Left 06/26/2022   Procedure: AMPUTATION TOE 5th toe;  Surgeon: Netta Cedars, MD;  Location: [facility];  Service: Orthopedics;  Laterality: Left;  choice   CARDIAC CATHETERIZATION     ELBOW SURGERY     from AA   KNEE SURGERY  2009   left knee orthroscopy   LEFT HEART CATHETERIZATION WITH CORONARY ANGIOGRAM N/A  01/10/2013   Procedure: LEFT HEART CATHETERIZATION WITH CORONARY ANGIOGRAM;  Surgeon: [name], MD;  Location: [facility];  Service: Cardiovascular;  Laterality: N/A;   PERIPHERAL VASCULAR INTERVENTION Left 10/30/2022   Procedure: PERIPHERAL VASCULAR INTERVENTION;  Surgeon: Cephus Shelling, MD;  Location: [facility];  Service: Cardiovascular;  Laterality: Left;   RIB PLATING Right 09/17/2020   Procedure: RIB PLATING 5-9;  Surgeon: Loreli Slot, MD;  Location: [facility];  Service: Thoracic;  Laterality: Right;    Social History   Tobacco Use   Smoking status: Every Day    Current packs/day: 0.00    Average packs/day: 1 pack/day for 15.0 years (15.0 ttl pk-yrs)    Types: Cigarettes    Start date: 01/09/1998    Last attempt to quit: 01/09/2013    Years since quitting: 10.5   Smokeless tobacco: Never  Vaping Use   Vaping status: Never Used  Substance Use Topics   Alcohol use: Yes    Comment: occ   Drug use: No    Family History  Problem Relation Age of Onset   CAD Brother 46   Stroke Father    COPD Sister    Drug abuse Brother    Healthy Son    Healthy Son    Healthy Daughter     No Known Allergies  OBJECTIVE: BP 130/83   Pulse 78   Resp 16   Ht 6' (1.829 m)   Wt 220 lb (99.8 kg)   BMI 29.84 kg/m   Physical Exam General - sitting in the chair, not in acute distress HEENT wnl Heart - s1s2 RRR Respiratory - Normal breath sounds  Abdomen - soft, non tender and non distended  MSK - no pedal edema, left lateral foot wound has healed, no signs of infection  Neuro - awake, alert and oriented, grossly nonfocal, following commands   Lab:    Latest Ref Rng & Units 07/30/2023    3:21 PM 07/13/2023    2:03 PM 10/30/2022    6:36 AM  CBC  WBC 3.8 - 10.8 Thousand/uL 7.5  9.2    Hemoglobin 13.2 - 17.1 g/dL 60.4  54.0  98.1   Hematocrit 38.5 - 50.0 % 54.1  54.5  53.0   Platelets 140 - 400 Thousand/uL 154  148        Latest Ref Rng & Units  07/30/2023    3:21 PM 07/13/2023    2:03 PM 10/30/2022    6:36 AM  CMP  Glucose 65 - 99 mg/dL 87  83  89   BUN 7 - 25 mg/dL 8  11  20    Creatinine 0.70 - 1.30 mg/dL 1.91  4.78  2.95   Sodium 135 - 146 mmol/L 140  138  138   Potassium 3.5 - 5.3 mmol/L 4.7  4.4  5.3   Chloride 98 - 110 mmol/L 101  101  102   CO2 20 - 32 mmol/L 29  28    Calcium 8.6 - 10.3 mg/dL 9.2  9.3    Total Protein 6.1 -  8.1 g/dL  7.2    Total Bilirubin 0.2 - 1.2 mg/dL  0.4    AST 10 - 35 U/L  14    ALT 9 - 46 U/L  13     Microbiology: Results for orders placed or performed during the hospital encounter of 06/24/23  Aerobic/Anaerobic Culture w Gram Stain (surgical/deep wound)     Status: None   Collection Time: 06/24/23  4:14 PM   Specimen: Path Tissue  Result Value Ref Range Status   Specimen Description BONE  Final   Special Requests LEFT 5TH METATARSAL HEAD  Final   Gram Stain NO WBC SEEN NO ORGANISMS SEEN   Final   Culture   Final    RARE PSEUDOMONAS AERUGINOSA RARE CUTIBACTERIUM ACNES Standardized susceptibility testing for this organism is not available. NO ANAEROBES ISOLATED Performed at [facility], [address] [place], [place] [address]    Report Status 06/29/2023 FINAL  Final   Organism ID, Bacteria PSEUDOMONAS AERUGINOSA  Final      Susceptibility   Pseudomonas aeruginosa - MIC*    CEFTAZIDIME 4 SENSITIVE Sensitive     CIPROFLOXACIN <=0.25 SENSITIVE Sensitive     GENTAMICIN <=1 SENSITIVE Sensitive     IMIPENEM 2 SENSITIVE Sensitive     PIP/TAZO >=128 RESISTANT Resistant ug/mL    * RARE PSEUDOMONAS AERUGINOSA    Imaging:   Assessment/plan Will need 6 weeks of treatment for bone infection Cx grew quinolone sensitive pseudomonas Discussed fda blackbox warning with quinolone  Also discussed AAA risk with smoking and quinonlone Patient not within age range of screening for AAA yet but given cipro use and smoking, advise him to discuss with pcp/vascular surgery regarding  earlier screening  Advised stopping smoking   For cipro, take high dose 750 mg twice a day for 4 weeks and then will decrease to 500 mg twice a day if clinically and labs show continued improvement. 2 more weeks until 07/26/23 for the 750 mg dose  See me in 2 more weeks  Clinically today incision without dehiscence  Labs today  __________________________________________________ 12/19 A/P  - Complete 2 weeks course of ciprofloxacin 500mg  po bid as planned. Cutibacterium acnes in cultures likely a skin contaminant - Labs today ( last qtc 410) - counseled on smoking cessation - Fu in 2 weeks   Follow-up: No follow-ups on file. 2 weeks   Victoriano Lain, MD [facility] 07/30/2023, 2:52 PM

## 2023-07-31 LAB — CBC
HCT: 54.1 % — ABNORMAL HIGH (ref 38.5–50.0)
Hemoglobin: 19.1 g/dL — ABNORMAL HIGH (ref 13.2–17.1)
MCH: 35.2 pg — ABNORMAL HIGH (ref 27.0–33.0)
MCHC: 35.3 g/dL (ref 32.0–36.0)
MCV: 99.6 fL (ref 80.0–100.0)
MPV: 11 fL (ref 7.5–12.5)
Platelets: 154 10*3/uL (ref 140–400)
RBC: 5.43 10*6/uL (ref 4.20–5.80)
RDW: 11.7 % (ref 11.0–15.0)
WBC: 7.5 10*3/uL (ref 3.8–10.8)

## 2023-07-31 LAB — BASIC METABOLIC PANEL
BUN: 8 mg/dL (ref 7–25)
CO2: 29 mmol/L (ref 20–32)
Calcium: 9.2 mg/dL (ref 8.6–10.3)
Chloride: 101 mmol/L (ref 98–110)
Creat: 0.82 mg/dL (ref 0.70–1.30)
Glucose, Bld: 87 mg/dL (ref 65–99)
Potassium: 4.7 mmol/L (ref 3.5–5.3)
Sodium: 140 mmol/L (ref 135–146)

## 2023-07-31 LAB — C-REACTIVE PROTEIN: CRP: 51.2 mg/L — ABNORMAL HIGH (ref <8.0)

## 2023-08-03 ENCOUNTER — Telehealth: Payer: Self-pay

## 2023-08-03 DIAGNOSIS — M869 Osteomyelitis, unspecified: Secondary | ICD-10-CM

## 2023-08-03 MED ORDER — CIPROFLOXACIN HCL 500 MG PO TABS: 500.0000 mg | ORAL_TABLET | Freq: Two times a day (BID) | ORAL | 0 refills | Status: AC

## 2023-08-03 NOTE — Addendum Note (Signed)
Addended by: Linna Hoff D on: 08/03/2023 05:12 PM   Modules accepted: Orders

## 2023-08-03 NOTE — Telephone Encounter (Signed)
Left voicemail asking patient to return my call.   Billey Wojciak P Farrin Shadle, CMA  

## 2023-08-03 NOTE — Telephone Encounter (Signed)
-----   Message from Victoriano Lain sent at 08/03/2023  9:54 AM EST ----- Please let him know his CRP has gone up from 22.8 to 51.2. His wound had almost healed last clinic visit. Please send him 2 weeks of ciprofloxacin 500 mg po bid to complete 8 weeks course. He should keep his fu appt with Dr Renold Don.  His hb is gradually trending up to 19.1, could be due to ? Infection vs others. Follow up with PCP.

## 2023-08-03 NOTE — Telephone Encounter (Addendum)
Second attempt to reach [name] [name], no answer.  Patient currently has active cipro Rx, per Dr. Elinor Parkinson, he needs an additional two weeks sent in. Refill sent.   Current Rx is for 250 mg PO BID, confirmed with Dr. Elinor Parkinson that he should be on 500 mg PO BID.   Sandie Ano, RN

## 2023-08-03 NOTE — Telephone Encounter (Signed)
error 

## 2023-08-10 DIAGNOSIS — K635 Polyp of colon: Secondary | ICD-10-CM | POA: Diagnosis not present

## 2023-08-10 DIAGNOSIS — Z860101 Personal history of adenomatous and serrated colon polyps: Secondary | ICD-10-CM | POA: Diagnosis not present

## 2023-08-10 DIAGNOSIS — Z1211 Encounter for screening for malignant neoplasm of colon: Secondary | ICD-10-CM | POA: Diagnosis not present

## 2023-08-13 DIAGNOSIS — M25511 Pain in right shoulder: Secondary | ICD-10-CM | POA: Diagnosis not present

## 2023-08-13 DIAGNOSIS — R0789 Other chest pain: Secondary | ICD-10-CM | POA: Diagnosis not present

## 2023-08-13 DIAGNOSIS — G8929 Other chronic pain: Secondary | ICD-10-CM | POA: Diagnosis not present

## 2023-08-14 ENCOUNTER — Ambulatory Visit: Payer: BC Managed Care – PPO | Admitting: Internal Medicine

## 2023-08-18 ENCOUNTER — Ambulatory Visit: Payer: BC Managed Care – PPO | Admitting: Internal Medicine

## 2023-08-21 DIAGNOSIS — K635 Polyp of colon: Secondary | ICD-10-CM | POA: Diagnosis not present

## 2023-09-07 DIAGNOSIS — F410 Panic disorder [episodic paroxysmal anxiety] without agoraphobia: Secondary | ICD-10-CM | POA: Diagnosis not present

## 2023-11-03 ENCOUNTER — Other Ambulatory Visit: Payer: Self-pay | Admitting: Vascular Surgery

## 2023-11-03 ENCOUNTER — Other Ambulatory Visit (HOSPITAL_COMMUNITY): Payer: Self-pay

## 2023-11-03 MED ORDER — OXYCODONE HCL 10 MG PO TABS
10.0000 mg | ORAL_TABLET | Freq: Every day | ORAL | 0 refills | Status: DC
  Filled 2023-11-03: qty 20, 20d supply, fill #0

## 2023-11-12 DIAGNOSIS — Z79899 Other long term (current) drug therapy: Secondary | ICD-10-CM | POA: Diagnosis not present

## 2023-11-12 DIAGNOSIS — G8929 Other chronic pain: Secondary | ICD-10-CM | POA: Diagnosis not present

## 2023-11-12 DIAGNOSIS — M792 Neuralgia and neuritis, unspecified: Secondary | ICD-10-CM | POA: Diagnosis not present

## 2023-11-12 DIAGNOSIS — R0789 Other chest pain: Secondary | ICD-10-CM | POA: Diagnosis not present

## 2023-11-17 ENCOUNTER — Ambulatory Visit (HOSPITAL_COMMUNITY): Payer: BC Managed Care – PPO

## 2023-11-17 ENCOUNTER — Ambulatory Visit: Payer: BC Managed Care – PPO

## 2023-12-03 ENCOUNTER — Telehealth: Payer: Self-pay

## 2023-12-03 NOTE — Telephone Encounter (Signed)
 Triage/Advice: -pt's wife called concerned b/c her husband's PAD symptoms are coming back like they were before he had stents put in. -he reports numbness, claudication, and periods when his leg/foot get cool.  He denies any significant swelling, discoloration, or wounds and confirm understanding to report to the ED if appropriate.  -appts booked for May 8th

## 2023-12-17 ENCOUNTER — Ambulatory Visit (HOSPITAL_COMMUNITY): Payer: Self-pay | Attending: Vascular Surgery

## 2023-12-17 ENCOUNTER — Ambulatory Visit: Payer: Self-pay | Attending: Vascular Surgery

## 2023-12-17 ENCOUNTER — Ambulatory Visit (HOSPITAL_COMMUNITY): Payer: Self-pay

## 2023-12-18 ENCOUNTER — Other Ambulatory Visit: Payer: Self-pay

## 2023-12-18 DIAGNOSIS — I739 Peripheral vascular disease, unspecified: Secondary | ICD-10-CM

## 2023-12-29 ENCOUNTER — Ambulatory Visit (HOSPITAL_BASED_OUTPATIENT_CLINIC_OR_DEPARTMENT_OTHER)
Admission: RE | Admit: 2023-12-29 | Discharge: 2023-12-29 | Disposition: A | Discharge status: Home / Self Care | Payer: Self-pay | Source: Ambulatory Visit | Attending: Vascular Surgery | Admitting: Vascular Surgery

## 2023-12-29 ENCOUNTER — Other Ambulatory Visit: Payer: Self-pay

## 2023-12-29 ENCOUNTER — Ambulatory Visit (INDEPENDENT_AMBULATORY_CARE_PROVIDER_SITE_OTHER): Admitting: Physician Assistant

## 2023-12-29 VITALS — BP 162/93 | HR 67 | Temp 97.8°F | Resp 18

## 2023-12-29 DIAGNOSIS — Z720 Tobacco use: Secondary | ICD-10-CM | POA: Insufficient documentation

## 2023-12-29 DIAGNOSIS — I252 Old myocardial infarction: Secondary | ICD-10-CM | POA: Diagnosis not present

## 2023-12-29 DIAGNOSIS — I743 Embolism and thrombosis of arteries of the lower extremities: Secondary | ICD-10-CM | POA: Diagnosis not present

## 2023-12-29 DIAGNOSIS — E785 Hyperlipidemia, unspecified: Secondary | ICD-10-CM | POA: Diagnosis not present

## 2023-12-29 DIAGNOSIS — F419 Anxiety disorder, unspecified: Secondary | ICD-10-CM | POA: Diagnosis not present

## 2023-12-29 DIAGNOSIS — Y831 Surgical operation with implant of artificial internal device as the cause of abnormal reaction of the patient, or of later complication, without mention of misadventure at the time of the procedure: Secondary | ICD-10-CM | POA: Diagnosis not present

## 2023-12-29 DIAGNOSIS — M109 Gout, unspecified: Secondary | ICD-10-CM | POA: Diagnosis not present

## 2023-12-29 DIAGNOSIS — Z9861 Coronary angioplasty status: Secondary | ICD-10-CM | POA: Diagnosis not present

## 2023-12-29 DIAGNOSIS — J449 Chronic obstructive pulmonary disease, unspecified: Secondary | ICD-10-CM | POA: Diagnosis not present

## 2023-12-29 DIAGNOSIS — F17210 Nicotine dependence, cigarettes, uncomplicated: Secondary | ICD-10-CM | POA: Diagnosis not present

## 2023-12-29 DIAGNOSIS — I10 Essential (primary) hypertension: Secondary | ICD-10-CM | POA: Diagnosis not present

## 2023-12-29 DIAGNOSIS — Z79899 Other long term (current) drug therapy: Secondary | ICD-10-CM | POA: Diagnosis not present

## 2023-12-29 DIAGNOSIS — I70222 Atherosclerosis of native arteries of extremities with rest pain, left leg: Secondary | ICD-10-CM | POA: Diagnosis not present

## 2023-12-29 DIAGNOSIS — F17200 Nicotine dependence, unspecified, uncomplicated: Secondary | ICD-10-CM | POA: Diagnosis not present

## 2023-12-29 DIAGNOSIS — I739 Peripheral vascular disease, unspecified: Secondary | ICD-10-CM

## 2023-12-29 DIAGNOSIS — Z95820 Peripheral vascular angioplasty status with implants and grafts: Secondary | ICD-10-CM | POA: Diagnosis not present

## 2023-12-29 DIAGNOSIS — I2510 Atherosclerotic heart disease of native coronary artery without angina pectoris: Secondary | ICD-10-CM | POA: Diagnosis not present

## 2023-12-29 DIAGNOSIS — Z7982 Long term (current) use of aspirin: Secondary | ICD-10-CM | POA: Diagnosis not present

## 2023-12-29 DIAGNOSIS — K219 Gastro-esophageal reflux disease without esophagitis: Secondary | ICD-10-CM | POA: Diagnosis not present

## 2023-12-29 DIAGNOSIS — Z89422 Acquired absence of other left toe(s): Secondary | ICD-10-CM | POA: Diagnosis not present

## 2023-12-29 DIAGNOSIS — Z8249 Family history of ischemic heart disease and other diseases of the circulatory system: Secondary | ICD-10-CM | POA: Diagnosis not present

## 2023-12-29 DIAGNOSIS — Z91148 Patient's other noncompliance with medication regimen for other reason: Secondary | ICD-10-CM | POA: Diagnosis not present

## 2023-12-29 DIAGNOSIS — G8929 Other chronic pain: Secondary | ICD-10-CM | POA: Diagnosis not present

## 2023-12-29 DIAGNOSIS — Z813 Family history of other psychoactive substance abuse and dependence: Secondary | ICD-10-CM | POA: Diagnosis not present

## 2023-12-29 DIAGNOSIS — T82856A Stenosis of peripheral vascular stent, initial encounter: Secondary | ICD-10-CM | POA: Diagnosis not present

## 2023-12-29 DIAGNOSIS — Z825 Family history of asthma and other chronic lower respiratory diseases: Secondary | ICD-10-CM | POA: Diagnosis not present

## 2023-12-29 DIAGNOSIS — I70202 Unspecified atherosclerosis of native arteries of extremities, left leg: Secondary | ICD-10-CM | POA: Diagnosis not present

## 2023-12-29 DIAGNOSIS — Z7902 Long term (current) use of antithrombotics/antiplatelets: Secondary | ICD-10-CM | POA: Diagnosis not present

## 2023-12-29 DIAGNOSIS — Z823 Family history of stroke: Secondary | ICD-10-CM | POA: Diagnosis not present

## 2023-12-29 LAB — VAS US ABI WITH/WO TBI
Left ABI: 0.52
Right ABI: 0.76

## 2023-12-30 ENCOUNTER — Encounter: Payer: Self-pay | Admitting: Physician Assistant

## 2023-12-30 NOTE — Progress Notes (Signed)
 Office Note     CC:  follow up Requesting Provider:  Chinnasamy, Krishnamani*  HPI: [name] [name] is a 58 y.o. ([birth year]) male who presents as an urgent add-on to clinic due to 3-week history of rest pain in his left foot.  This started suddenly.  He has history of a left fifth toe partial amputation by Dr. Cherl Corner in November 2023.  This was slow to heal thus he underwent left SFA angioplasty and stenting due to some long segment chronic total occlusion on 10/30/2022 by Dr. Fulton Job.  He was seen in the office postoperatively however still had some drainage from partial toe amputation.  He then underwent further fifth toe amputation and has since healed.  He also has numbness in his left foot however is able to move his ankle and wiggle his toes.  He is taking his aspirin  and Plavix  daily.  He smokes 1 pack of cigarettes every 2 days.   Past Medical History:  Diagnosis Date   Anxiety    Chronic pain disorder    COPD (chronic obstructive pulmonary disease) (HCC)    smoker   Coronary artery disease    a. NSTEMI 01/2013 s/p PTCA to prox RCA occlusion.   GERD (gastroesophageal reflux disease)    Gout    Headache(784.0)    HLD (hyperlipidemia)    Hypertension    Intercostal neuralgia    multiple rib fractures 2022   Screening for chemical poisoning and contamination    Tobacco abuse     Past Surgical History:  Procedure Laterality Date   ABDOMINAL AORTOGRAM W/LOWER EXTREMITY N/A 10/30/2022   Procedure: ABDOMINAL AORTOGRAM W/LOWER EXTREMITY;  Surgeon: Young Hensen, MD;  Location: [facility];  Service: Cardiovascular;  Laterality: N/A;   AMPUTATION Left 06/24/2023   Procedure: LEFT FIFTH TOE METATARSALPHALANGEAL DISARTICULATION AND AMPUTATION;  Surgeon: Ali Ink, MD;  Location: [facility];  Service: Orthopedics;  Laterality: Left;   AMPUTATION TOE Left 06/26/2022   Procedure: AMPUTATION TOE 5th toe;  Surgeon: Ali Ink, MD;  Location:  [facility];  Service: Orthopedics;  Laterality: Left;  choice   CARDIAC CATHETERIZATION     ELBOW SURGERY     from AA   KNEE SURGERY  2009   left knee orthroscopy   LEFT HEART CATHETERIZATION WITH CORONARY ANGIOGRAM N/A 01/10/2013   Procedure: LEFT HEART CATHETERIZATION WITH CORONARY ANGIOGRAM;  Surgeon: Lake Pilgrim, MD;  Location: [facility] CATH LAB;  Service: Cardiovascular;  Laterality: N/A;   PERIPHERAL VASCULAR INTERVENTION Left 10/30/2022   Procedure: PERIPHERAL VASCULAR INTERVENTION;  Surgeon: Young Hensen, MD;  Location: [facility];  Service: Cardiovascular;  Laterality: Left;   RIB PLATING Right 09/17/2020   Procedure: RIB PLATING 5-9;  Surgeon: Zelphia Higashi, MD;  Location: [facility] OR;  Service: Thoracic;  Laterality: Right;    Social History   Socioeconomic History   Marital status: Married    Spouse name: Not on file   Number of children: Not on file   Years of education: Not on file   Highest education level: Not on file  Occupational History   Not on file  Tobacco Use   Smoking status: Every Day    Current packs/day: 0.00    Average packs/day: 1 pack/day for 15.0 years (15.0 ttl pk-yrs)    Types: Cigarettes    Start date: 01/09/1998    Last attempt to quit: 01/09/2013    Years since quitting: 10.9   Smokeless tobacco: Never  Vaping Use   Vaping status: Never Used  Substance and Sexual Activity   Alcohol  use: Yes    Comment: occ   Drug use: No   Sexual activity: Yes  Other Topics Concern   Not on file  Social History Narrative   Not on file   Social Drivers of Health   Financial Resource Strain: Not on file  Food Insecurity: Not on file  Transportation Needs: Not on file  Physical Activity: Not on file  Stress: Not on file  Social Connections: Unknown (12/24/2021)   Received [facility], [facility]   Social Network    Social Network: Not on file  Intimate Partner Violence: Unknown (11/15/2021)   Received from  [facility], [facility]   HITS    Physically Hurt: Not on file    Insult or Talk Down To: Not on file    Threaten Physical Harm: Not on file    Scream or Curse: Not on file    Family History  Problem Relation Age of Onset   CAD Brother 45   Stroke Father    COPD Sister    Drug abuse Brother    Healthy Son    Healthy Son    Healthy Daughter     Current Outpatient Medications  Medication Sig Dispense Refill   allopurinol  (ZYLOPRIM ) 100 MG tablet Take 1 tablet (100 mg total) by mouth daily. 90 tablet 0   ALPRAZolam  (XANAX ) 1 MG tablet Take 1 tablet (1 mg total) by mouth 4 (four) times daily as needed. 120 tablet 2   aspirin  EC (ASPIRIN  81) 81 MG tablet Take 1 tablet (81 mg total) by mouth daily with food 90 tablet 0   clopidogrel  (PLAVIX ) 75 MG tablet TAKE 1 TABLET BY MOUTH DAILY 90 tablet 3   DULoxetine  (CYMBALTA ) 30 MG capsule Take 2 capsules (60 mg total) by mouth daily. 60 capsule 1   metoprolol  succinate (TOPROL -XL) 50 MG 24 hr tablet Take 1 tablet (50 mg total) by mouth daily. (Patient taking differently: Take 25 mg by mouth daily.) 90 tablet 0   nitroGLYCERIN  (NITROSTAT ) 0.4 MG SL tablet Place 1 tablet (0.4 mg total) under the tongue as needed for chest pain 25 tablet 1   pregabalin  (LYRICA ) 150 MG capsule Take 1 capsule (150 mg total) by mouth 2 (two) times daily. 60 capsule 1   rosuvastatin  (CRESTOR ) 40 MG tablet Take 1 tablet (40 mg total) by mouth at bedtime. 90 tablet 0   sildenafil  (VIAGRA ) 100 MG tablet Take 1 tablet (100 mg total) by mouth as needed, use 2-3 hours before sexual activity, max of 8-10 tablets in a month 30 tablet 0   acetaminophen  (TYLENOL ) 500 MG tablet Take 1,000 mg by mouth every 6 (six) hours as needed for mild pain (pain score 1-3) or moderate pain (pain score 4-6).     BELBUCA 150 MCG FILM Take 1 Film by mouth 2 (two) times daily.     No current facility-administered medications for this visit.    No Known Allergies   REVIEW OF SYSTEMS:    [X]  denotes positive finding, [ ]  denotes negative finding Cardiac  Comments:  Chest pain or chest pressure:    Shortness of breath upon exertion:    Short of breath when lying flat:    Irregular heart rhythm:        Vascular    Pain in calf, thigh, or hip brought on by ambulation:    Pain in feet at night  that wakes you up from your sleep:     Blood clot in your veins:    Leg swelling:         Pulmonary    Oxygen at home:    Productive cough:     Wheezing:         Neurologic    Sudden weakness in arms or legs:     Sudden numbness in arms or legs:     Sudden onset of difficulty speaking or slurred speech:    Temporary loss of vision in one eye:     Problems with dizziness:         Gastrointestinal    Blood in stool:     Vomited blood:         Genitourinary    Burning when urinating:     Blood in urine:        Psychiatric    Major depression:         Hematologic    Bleeding problems:    Problems with blood clotting too easily:        Skin    Rashes or ulcers:        Constitutional    Fever or chills:      PHYSICAL EXAMINATION:  Vitals:   12/29/23 1454  BP: (!) 162/93  Pulse: 67  Resp: 18  Temp: 97.8 F (36.6 C)  TempSrc: Temporal  SpO2: (!) 87%    General:  WDWN in NAD; vital signs documented above Gait: Not observed HENT: WNL, normocephalic Pulmonary: normal non-labored breathing , without Rales, rhonchi,  wheezing Cardiac: regular HR Abdomen: soft, NT, no masses Skin: without rashes Vascular Exam/Pulses: Absent left pedal pulses; palpable right femoral pulse, palpable right DP pulse Extremities: without ischemic changes, without Gangrene , without cellulitis; without open wounds; some erythema in the toes of the left foot Musculoskeletal: no muscle wasting or atrophy  Neurologic: A&O X 3 Psychiatric:  The pt has Normal affect.   Non-Invasive Vascular Imaging:   Left SFA stent occluded by duplex Reconstitution of the below the knee  popliteal artery  Left ABI of 0.5 with a TBI of 0    ASSESSMENT/PLAN:: 58 y.o. male who presented to the clinic as an urgent add-on due to 3-week history of rest pain in his left foot  Mr. Critzer has a 3-week history of left foot rest pain.  He believes this started suddenly 3 weeks ago.  Duplex demonstrates an occluded left SFA stent with an ABI of 0.5 and a toe pressure of 0.  This represents critical limb ischemia.  We discussed proceeding with aortogram with left lower extremity runoff and intervention of the occluded SFA stent with possible lysis.  This will be performed by Dr. Fulton Job on 12/31/2023.  He will continue his aspirin  and Plavix  perioperatively.  I encouraged smoking cessation.  We also discussed the possibility of being unable to revascularize the left leg endovascularly in which case he would require bypass surgery.  He is agreeable to proceed.   Cordie Deters, PA-C [facility] [phone]  Clinic MD:   Fulton Job

## 2023-12-31 ENCOUNTER — Encounter (HOSPITAL_COMMUNITY)
Admission: AD | Disposition: A | Discharge status: Home / Self Care | Payer: Self-pay | Source: Home / Self Care | Attending: Vascular Surgery

## 2023-12-31 ENCOUNTER — Other Ambulatory Visit: Payer: Self-pay

## 2023-12-31 ENCOUNTER — Inpatient Hospital Stay (HOSPITAL_COMMUNITY)
Admission: AD | Admit: 2023-12-31 | Discharge: 2024-01-02 | DRG: 279 | Disposition: A | Discharge status: Home / Self Care | Attending: Vascular Surgery | Admitting: Vascular Surgery

## 2023-12-31 DIAGNOSIS — I252 Old myocardial infarction: Secondary | ICD-10-CM | POA: Diagnosis not present

## 2023-12-31 DIAGNOSIS — Z9861 Coronary angioplasty status: Secondary | ICD-10-CM

## 2023-12-31 DIAGNOSIS — Z79899 Other long term (current) drug therapy: Secondary | ICD-10-CM | POA: Diagnosis not present

## 2023-12-31 DIAGNOSIS — T82856A Stenosis of peripheral vascular stent, initial encounter: Principal | ICD-10-CM

## 2023-12-31 DIAGNOSIS — Z825 Family history of asthma and other chronic lower respiratory diseases: Secondary | ICD-10-CM | POA: Diagnosis not present

## 2023-12-31 DIAGNOSIS — I70222 Atherosclerosis of native arteries of extremities with rest pain, left leg: Secondary | ICD-10-CM | POA: Diagnosis not present

## 2023-12-31 DIAGNOSIS — J449 Chronic obstructive pulmonary disease, unspecified: Secondary | ICD-10-CM | POA: Diagnosis present

## 2023-12-31 DIAGNOSIS — Z91148 Patient's other noncompliance with medication regimen for other reason: Secondary | ICD-10-CM | POA: Diagnosis not present

## 2023-12-31 DIAGNOSIS — Z823 Family history of stroke: Secondary | ICD-10-CM | POA: Diagnosis not present

## 2023-12-31 DIAGNOSIS — G8929 Other chronic pain: Secondary | ICD-10-CM | POA: Diagnosis present

## 2023-12-31 DIAGNOSIS — Z8249 Family history of ischemic heart disease and other diseases of the circulatory system: Secondary | ICD-10-CM

## 2023-12-31 DIAGNOSIS — Z7982 Long term (current) use of aspirin: Secondary | ICD-10-CM

## 2023-12-31 DIAGNOSIS — Z813 Family history of other psychoactive substance abuse and dependence: Secondary | ICD-10-CM | POA: Diagnosis not present

## 2023-12-31 DIAGNOSIS — Z89422 Acquired absence of other left toe(s): Secondary | ICD-10-CM | POA: Diagnosis not present

## 2023-12-31 DIAGNOSIS — F17210 Nicotine dependence, cigarettes, uncomplicated: Secondary | ICD-10-CM | POA: Diagnosis present

## 2023-12-31 DIAGNOSIS — I743 Embolism and thrombosis of arteries of the lower extremities: Secondary | ICD-10-CM | POA: Diagnosis not present

## 2023-12-31 DIAGNOSIS — I10 Essential (primary) hypertension: Secondary | ICD-10-CM | POA: Diagnosis present

## 2023-12-31 DIAGNOSIS — I70202 Unspecified atherosclerosis of native arteries of extremities, left leg: Secondary | ICD-10-CM | POA: Diagnosis not present

## 2023-12-31 DIAGNOSIS — M109 Gout, unspecified: Secondary | ICD-10-CM | POA: Diagnosis present

## 2023-12-31 DIAGNOSIS — Y831 Surgical operation with implant of artificial internal device as the cause of abnormal reaction of the patient, or of later complication, without mention of misadventure at the time of the procedure: Secondary | ICD-10-CM | POA: Diagnosis present

## 2023-12-31 DIAGNOSIS — Z7902 Long term (current) use of antithrombotics/antiplatelets: Secondary | ICD-10-CM | POA: Diagnosis not present

## 2023-12-31 DIAGNOSIS — E785 Hyperlipidemia, unspecified: Secondary | ICD-10-CM | POA: Diagnosis present

## 2023-12-31 DIAGNOSIS — F419 Anxiety disorder, unspecified: Secondary | ICD-10-CM | POA: Diagnosis present

## 2023-12-31 DIAGNOSIS — I2510 Atherosclerotic heart disease of native coronary artery without angina pectoris: Secondary | ICD-10-CM | POA: Diagnosis present

## 2023-12-31 DIAGNOSIS — K219 Gastro-esophageal reflux disease without esophagitis: Secondary | ICD-10-CM | POA: Diagnosis present

## 2023-12-31 DIAGNOSIS — F17200 Nicotine dependence, unspecified, uncomplicated: Secondary | ICD-10-CM | POA: Diagnosis not present

## 2023-12-31 DIAGNOSIS — Z95820 Peripheral vascular angioplasty status with implants and grafts: Secondary | ICD-10-CM | POA: Diagnosis not present

## 2023-12-31 HISTORY — PX: LOWER EXTREMITY INTERVENTION: CATH118252

## 2023-12-31 HISTORY — PX: LOWER EXTREMITY ANGIOGRAPHY: CATH118251

## 2023-12-31 HISTORY — PX: ABDOMINAL AORTOGRAM: CATH118222

## 2023-12-31 LAB — CBC
HCT: 58.7 % — ABNORMAL HIGH (ref 39.0–52.0)
HCT: 54.4 % — ABNORMAL HIGH (ref 39.0–52.0)
Hemoglobin: 19.6 g/dL — ABNORMAL HIGH (ref 13.0–17.0)
Hemoglobin: 17.9 g/dL — ABNORMAL HIGH (ref 13.0–17.0)
MCH: 33.7 pg (ref 26.0–34.0)
MCH: 33.6 pg (ref 26.0–34.0)
MCHC: 33.4 g/dL (ref 30.0–36.0)
MCHC: 32.9 g/dL (ref 30.0–36.0)
MCV: 101 fL — ABNORMAL HIGH (ref 80.0–100.0)
MCV: 102.3 fL — ABNORMAL HIGH (ref 80.0–100.0)
Platelets: 135 10*3/uL — ABNORMAL LOW (ref 150–400)
Platelets: 120 10*3/uL — ABNORMAL LOW (ref 150–400)
RBC: 5.81 MIL/uL (ref 4.22–5.81)
RBC: 5.32 MIL/uL (ref 4.22–5.81)
RDW: 14 % (ref 11.5–15.5)
RDW: 14 % (ref 11.5–15.5)
WBC: 6.7 10*3/uL (ref 4.0–10.5)
WBC: 9.1 10*3/uL (ref 4.0–10.5)
nRBC: 0 % (ref 0.0–0.2)
nRBC: 0 % (ref 0.0–0.2)

## 2023-12-31 LAB — POCT I-STAT, CHEM 8
BUN: 12 mg/dL (ref 6–20)
Calcium, Ion: 1.19 mmol/L (ref 1.15–1.40)
Chloride: 100 mmol/L (ref 98–111)
Creatinine, Ser: 1 mg/dL (ref 0.61–1.24)
Glucose, Bld: 109 mg/dL — ABNORMAL HIGH (ref 70–99)
HCT: 59 % — ABNORMAL HIGH (ref 39.0–52.0)
Hemoglobin: 20.1 g/dL — ABNORMAL HIGH (ref 13.0–17.0)
Potassium: 4 mmol/L (ref 3.5–5.1)
Sodium: 142 mmol/L (ref 135–145)
TCO2: 28 mmol/L (ref 22–32)

## 2023-12-31 LAB — POCT ACTIVATED CLOTTING TIME: Activated Clotting Time: 308 s

## 2023-12-31 LAB — HEPARIN LEVEL (UNFRACTIONATED)
Heparin Unfractionated: 0.1 [IU]/mL — ABNORMAL LOW (ref 0.30–0.70)
Heparin Unfractionated: <0.1 [IU]/mL — ABNORMAL LOW (ref 0.30–0.70)

## 2023-12-31 LAB — MRSA NEXT GEN BY PCR, NASAL: MRSA by PCR Next Gen: NOT DETECTED

## 2023-12-31 LAB — FIBRINOGEN
Fibrinogen: 397 mg/dL (ref 210–475)
Fibrinogen: 275 mg/dL (ref 210–475)

## 2023-12-31 MED ORDER — HEPARIN (PORCINE) IN NACL 1000-0.9 UT/500ML-% IV SOLN
INTRAVENOUS | Status: DC | PRN
  Administered 2023-12-31 (×2): 500 mL

## 2023-12-31 MED ORDER — MORPHINE SULFATE (PF) 2 MG/ML IV SOLN
2.0000 mg | INTRAVENOUS | Status: DC | PRN
  Administered 2023-12-31 – 2024-01-01 (×7): 2 mg via INTRAVENOUS
  Filled 2023-12-31 (×7): qty 1

## 2023-12-31 MED ORDER — SODIUM CHLORIDE 0.9% FLUSH
3.0000 mL | Freq: Two times a day (BID) | INTRAVENOUS | Status: DC
  Administered 2023-12-31 – 2024-01-02 (×5): 3 mL via INTRAVENOUS

## 2023-12-31 MED ORDER — IODIXANOL 320 MG/ML IV SOLN
INTRAVENOUS | Status: DC | PRN
  Administered 2023-12-31: 40 mL

## 2023-12-31 MED ORDER — MIDAZOLAM HCL 2 MG/2ML IJ SOLN
INTRAMUSCULAR | Status: DC | PRN
  Administered 2023-12-31: 1 mg via INTRAVENOUS

## 2023-12-31 MED ORDER — CHLORHEXIDINE GLUCONATE CLOTH 2 % EX PADS
6.0000 | MEDICATED_PAD | Freq: Every day | CUTANEOUS | Status: DC
  Administered 2023-12-31 – 2024-01-02 (×3): 6 via TOPICAL

## 2023-12-31 MED ORDER — ORAL CARE MOUTH RINSE: 15.0000 mL | OROMUCOSAL | Status: DC | PRN

## 2023-12-31 MED ORDER — ONDANSETRON HCL 4 MG/2ML IJ SOLN: 4.0000 mg | Freq: Four times a day (QID) | INTRAMUSCULAR | Status: DC | PRN

## 2023-12-31 MED ORDER — FENTANYL CITRATE (PF) 100 MCG/2ML IJ SOLN
INTRAMUSCULAR | Status: DC | PRN
  Administered 2023-12-31: 25 ug via INTRAVENOUS

## 2023-12-31 MED ORDER — LIDOCAINE HCL (PF) 1 % IJ SOLN
INTRAMUSCULAR | Status: DC | PRN
  Administered 2023-12-31: 15 mL via INTRADERMAL

## 2023-12-31 MED ORDER — HEPARIN SODIUM (PORCINE) 1000 UNIT/ML IJ SOLN
INTRAMUSCULAR | Status: DC | PRN
  Administered 2023-12-31: 10000 [IU] via INTRAVENOUS

## 2023-12-31 MED ORDER — MIDAZOLAM HCL 2 MG/2ML IJ SOLN
1.0000 mg | INTRAMUSCULAR | Status: DC | PRN
  Administered 2023-12-31 – 2024-01-01 (×11): 1 mg via INTRAVENOUS
  Filled 2023-12-31 (×6): qty 2

## 2023-12-31 MED ORDER — OXYCODONE HCL 5 MG PO TABS
5.0000 mg | ORAL_TABLET | ORAL | Status: DC | PRN
  Administered 2023-12-31 – 2024-01-02 (×8): 5 mg via ORAL
  Filled 2023-12-31 (×8): qty 1

## 2023-12-31 MED ORDER — DULOXETINE HCL 60 MG PO CPEP
60.0000 mg | ORAL_CAPSULE | Freq: Every day | ORAL | Status: DC
  Administered 2023-12-31 – 2024-01-02 (×3): 60 mg via ORAL
  Filled 2023-12-31 (×3): qty 1

## 2023-12-31 MED ORDER — HEPARIN SODIUM (PORCINE) 1000 UNIT/ML IJ SOLN
INTRAMUSCULAR | Status: AC
  Filled 2023-12-31: qty 10

## 2023-12-31 MED ORDER — HEPARIN (PORCINE) 25000 UT/250ML-% IV SOLN
1200.0000 [IU]/h | INTRAVENOUS | Status: DC
Start: 2023-12-31 — End: 2024-01-01
  Administered 2023-12-31: 800 [IU]/h via INTRAVENOUS
  Filled 2023-12-31: qty 250

## 2023-12-31 MED ORDER — SODIUM CHLORIDE 0.9 % IV SOLN: INTRAVENOUS | Status: AC

## 2023-12-31 MED ORDER — OXYCODONE HCL 5 MG PO TABS
ORAL_TABLET | ORAL | Status: AC
  Filled 2023-12-31: qty 1

## 2023-12-31 MED ORDER — ASPIRIN 81 MG PO TBEC
81.0000 mg | DELAYED_RELEASE_TABLET | Freq: Every day | ORAL | Status: DC
  Administered 2024-01-01 – 2024-01-02 (×2): 81 mg via ORAL
  Filled 2023-12-31 (×2): qty 1

## 2023-12-31 MED ORDER — ROSUVASTATIN CALCIUM 20 MG PO TABS
40.0000 mg | ORAL_TABLET | Freq: Every day | ORAL | Status: DC
  Administered 2024-01-01 – 2024-01-02 (×2): 40 mg via ORAL
  Filled 2023-12-31 (×4): qty 2

## 2023-12-31 MED ORDER — LIDOCAINE HCL (PF) 1 % IJ SOLN
INTRAMUSCULAR | Status: AC
  Filled 2023-12-31: qty 30

## 2023-12-31 MED ORDER — MIDAZOLAM HCL 2 MG/2ML IJ SOLN
INTRAMUSCULAR | Status: AC
  Filled 2023-12-31: qty 2

## 2023-12-31 MED ORDER — SODIUM CHLORIDE 0.9% FLUSH: 3.0000 mL | INTRAVENOUS | Status: DC | PRN

## 2023-12-31 MED ORDER — METOPROLOL SUCCINATE ER 50 MG PO TB24
50.0000 mg | ORAL_TABLET | Freq: Every day | ORAL | Status: DC
  Administered 2024-01-01 – 2024-01-02 (×2): 50 mg via ORAL
  Filled 2023-12-31 (×2): qty 1

## 2023-12-31 MED ORDER — SODIUM CHLORIDE 0.9 % IV SOLN: 250.0000 mL | INTRAVENOUS | Status: AC | PRN

## 2023-12-31 MED ORDER — FENTANYL CITRATE (PF) 100 MCG/2ML IJ SOLN
INTRAMUSCULAR | Status: AC
  Filled 2023-12-31: qty 2

## 2023-12-31 MED ORDER — SODIUM CHLORIDE 0.9 % IV SOLN
1.0000 mg/h | INTRAVENOUS | Status: DC
  Administered 2023-12-31 – 2024-01-01 (×3): 1 mg/h
  Filled 2023-12-31 (×6): qty 10

## 2023-12-31 MED ORDER — PREGABALIN 75 MG PO CAPS
150.0000 mg | ORAL_CAPSULE | Freq: Two times a day (BID) | ORAL | Status: DC
  Administered 2023-12-31 – 2024-01-02 (×4): 150 mg via ORAL
  Filled 2023-12-31 (×4): qty 2

## 2023-12-31 MED ORDER — OXYCODONE HCL 5 MG PO TABS
5.0000 mg | ORAL_TABLET | ORAL | Status: DC | PRN
  Administered 2023-12-31: 5 mg via ORAL

## 2023-12-31 MED ORDER — CLOPIDOGREL BISULFATE 75 MG PO TABS
75.0000 mg | ORAL_TABLET | Freq: Every day | ORAL | Status: DC
  Administered 2024-01-01 – 2024-01-02 (×2): 75 mg via ORAL
  Filled 2023-12-31 (×2): qty 1

## 2023-12-31 MED ORDER — SODIUM CHLORIDE 0.9 % IV SOLN: INTRAVENOUS | Status: DC

## 2023-12-31 NOTE — Progress Notes (Signed)
 PHARMACY - ANTICOAGULATION CONSULT NOTE  Pharmacy Consult for heparin  Indication: critical limb ischemia  No Known Allergies  Patient Measurements: Height: 6' (182.9 cm) Weight: 96.6 kg (213 lb) IBW/kg (Calculated) : 77.6 HEPARIN  DW (KG): 96.6  Vital Signs: Temp: 98.6 F (37 C) (05/22 2039) Temp Source: Oral (05/22 2039) BP: 105/80 (05/22 1902) Pulse Rate: 65 (05/22 1902)  Labs: Recent Labs    12/31/23 0554 12/31/23 1026 12/31/23 1206 12/31/23 1954  HGB 20.1* 19.6*  --  17.9*  HCT 59.0* 58.7*  --  54.4*  PLT  --  135*  --  120*  HEPARINUNFRC  --   --  0.10* <0.10*  CREATININE 1.00  --   --   --     Estimated Creatinine Clearance: 99.4 mL/min (by C-G formula based on SCr of 1 mg/dL).   Medical History: Past Medical History:  Diagnosis Date   Anxiety    Chronic pain disorder    COPD (chronic obstructive pulmonary disease) (HCC)    smoker   Coronary artery disease    a. NSTEMI 01/2013 s/p PTCA to prox RCA occlusion.   GERD (gastroesophageal reflux disease)    Gout    Headache(784.0)    HLD (hyperlipidemia)    Hypertension    Intercostal neuralgia    multiple rib fractures 2022   Screening for chemical poisoning and contamination    Tobacco abuse     Medications:  Scheduled:   [START ON 01/01/2024] aspirin  EC  81 mg Oral Daily   Chlorhexidine  Gluconate Cloth  6 each Topical Daily   [START ON 01/01/2024] clopidogrel   75 mg Oral Daily   DULoxetine   60 mg Oral Daily   [START ON 01/01/2024] metoprolol  succinate  50 mg Oral Daily   oxyCODONE        pregabalin   150 mg Oral BID   [START ON 01/01/2024] rosuvastatin   40 mg Oral Daily   sodium chloride  flush  3 mL Intravenous Q12H    Assessment: 57 yom presenting with a 3 week history of rest pain in L foot - duplex finding occluded L SFA stent. Underwent aortogram with initiations of EKOS. No AC PTA.   Hgb 19.6, plt 135. Received 10,000 units of heparin  during procedure. Was started on heparin  infusion at 800  units/hr - also receiving alteplase at 1 mg/hr. No s/sx of bleeding.   PM Heparin  level undetectable on 900 units/hr. RN reports slight oozing near infusion site, no issues with infusion.   Goal of Therapy:  Heparin  level 0.2-0.5 units/ml Monitor platelets by anticoagulation protocol: Yes   Plan:  Increase heparin  infusion to 1000 units/hr  Continue alteplase 1 mg/hr  Follow heparin  level, fibrinogen, CBC every 6 hours, and for s/sx of bleeding while on catheter directed lysis  Thank you for allowing pharmacy to participate in this patient's care,  Mohammed Andrew, PharmD Clinical Pharmacist 12/31/2023 8:54 PM Please check AMION for all [facility] Pharmacy numbers

## 2023-12-31 NOTE — Progress Notes (Signed)
 Orthopedic Tech Progress Note Patient Details:  [name] [name] Dec 21, 1965 [id number]  Ortho Devices Type of Ortho Device: Knee Immobilizer Ortho Device/Splint Location: RLE Ortho Device/Splint Interventions: Ordered, Application, Adjustment   Post Interventions Patient Tolerated: Well Instructions Provided: Care of device  [name] [name] [name] [name] 12/31/2023, 8:21 PM

## 2023-12-31 NOTE — H&P (Signed)
 History and Physical Interval Note:  12/31/2023 7:27 AM  [name] [name]  has presented today for surgery, with the diagnosis of atherosclerosis of left leg w/ claudication.  The various methods of treatment have been discussed with the patient and family. After consideration of risks, benefits and other options for treatment, the patient has consented to  Procedure(s): ABDOMINAL AORTOGRAM (N/A) Lower Extremity Angiography (N/A) LOWER EXTREMITY INTERVENTION (N/A) as a surgical intervention.  The patient's history has been reviewed, patient examined, no change in status, stable for surgery.  I have reviewed the patient's chart and labs.  Questions were answered to the patient's satisfaction.     Young Hensen   Office Note        CC:  follow up Requesting Provider:  Chinnasamy, Krishnamani*   HPI: [name] [name] is a 58 y.o. (1965/09/28) male who presents as an urgent add-on to clinic due to 3-week history of rest pain in his left foot.  This started suddenly.  He has history of a left fifth toe partial amputation by Dr. Cherl Corner in November 2023.  This was slow to heal thus he underwent left SFA angioplasty and stenting due to some long segment chronic total occlusion on 10/30/2022 by Dr. Fulton Job.  He was seen in the office postoperatively however still had some drainage from partial toe amputation.  He then underwent further fifth toe amputation and has since healed.  He also has numbness in his left foot however is able to move his ankle and wiggle his toes.  He is taking his aspirin  and Plavix  daily.  He smokes 1 pack of cigarettes every 2 days.         Past Medical History:  Diagnosis Date   Anxiety     Chronic pain disorder     COPD (chronic obstructive pulmonary disease) (HCC)      smoker   Coronary artery disease      a. NSTEMI 01/2013 s/p PTCA to prox RCA occlusion.   GERD (gastroesophageal reflux disease)     Gout     Headache(784.0)     HLD (hyperlipidemia)     Hypertension      Intercostal neuralgia      multiple rib fractures 2022   Screening for chemical poisoning and contamination     Tobacco abuse                 Past Surgical History:  Procedure Laterality Date   ABDOMINAL AORTOGRAM W/LOWER EXTREMITY N/A 10/30/2022    Procedure: ABDOMINAL AORTOGRAM W/LOWER EXTREMITY;  Surgeon: Young Hensen, MD;  Location: [facility];  Service: Cardiovascular;  Laterality: N/A;   AMPUTATION Left 06/24/2023    Procedure: LEFT FIFTH TOE METATARSALPHALANGEAL DISARTICULATION AND AMPUTATION;  Surgeon: Ali Ink, MD;  Location: [facility];  Service: Orthopedics;  Laterality: Left;   AMPUTATION TOE Left 06/26/2022    Procedure: AMPUTATION TOE 5th toe;  Surgeon: Ali Ink, MD;  Location: [facility];  Service: Orthopedics;  Laterality: Left;  choice   CARDIAC CATHETERIZATION       ELBOW SURGERY        from AA   KNEE SURGERY   2009    left knee orthroscopy   LEFT HEART CATHETERIZATION WITH CORONARY ANGIOGRAM N/A 01/10/2013    Procedure: LEFT HEART CATHETERIZATION WITH CORONARY ANGIOGRAM;  Surgeon: Lake Pilgrim, MD;  Location: [facility] CATH LAB;  Service: Cardiovascular;  Laterality: N/A;   PERIPHERAL VASCULAR INTERVENTION Left 10/30/2022  Procedure: PERIPHERAL VASCULAR INTERVENTION;  Surgeon: Young Hensen, MD;  Location: [facility] INVASIVE CV LAB;  Service: Cardiovascular;  Laterality: Left;   RIB PLATING Right 09/17/2020    Procedure: RIB PLATING 5-9;  Surgeon: Zelphia Higashi, MD;  Location: [facility] OR;  Service: Thoracic;  Laterality: Right;          Social History         Socioeconomic History   Marital status: Married      Spouse name: Not on file   Number of children: Not on file   Years of education: Not on file   Highest education level: Not on file  Occupational History   Not on file  Tobacco Use   Smoking status: Every Day      Current packs/day: 0.00      Average packs/day: 1 pack/day for  15.0 years (15.0 ttl pk-yrs)      Types: Cigarettes      Start date: 01/09/1998      Last attempt to quit: 01/09/2013      Years since quitting: 10.9   Smokeless tobacco: Never  Vaping Use   Vaping status: Never Used  Substance and Sexual Activity   Alcohol  use: Yes      Comment: occ   Drug use: No   Sexual activity: Yes  Other Topics Concern   Not on file  Social History Narrative   Not on file    Social Drivers of Health        Financial Resource Strain: Not on file  Food Insecurity: Not on file  Transportation Needs: Not on file  Physical Activity: Not on file  Stress: Not on file  Social Connections: Unknown (12/24/2021)    Received from [facility], [facility]    Social Network     Social Network: Not on file  Intimate Partner Violence: Unknown (11/15/2021)    Received from [facility], [facility]    HITS     Physically Hurt: Not on file     Insult or Talk Down To: Not on file     Threaten Physical Harm: Not on file     Scream or Curse: Not on file           Family History  Problem Relation Age of Onset   CAD Brother 89   Stroke Father     COPD Sister     Drug abuse Brother     Healthy Son     Healthy Son     Healthy Daughter                  Current Outpatient Medications  Medication Sig Dispense Refill   allopurinol  (ZYLOPRIM ) 100 MG tablet Take 1 tablet (100 mg total) by mouth daily. 90 tablet 0   ALPRAZolam  (XANAX ) 1 MG tablet Take 1 tablet (1 mg total) by mouth 4 (four) times daily as needed. 120 tablet 2   aspirin  EC (ASPIRIN  81) 81 MG tablet Take 1 tablet (81 mg total) by mouth daily with food 90 tablet 0   clopidogrel  (PLAVIX ) 75 MG tablet TAKE 1 TABLET BY MOUTH DAILY 90 tablet 3   DULoxetine  (CYMBALTA ) 30 MG capsule Take 2 capsules (60 mg total) by mouth daily. 60 capsule 1   metoprolol  succinate (TOPROL -XL) 50 MG 24 hr tablet Take 1 tablet (50 mg total) by mouth daily. (Patient taking differently: Take 25 mg by mouth daily.) 90 tablet 0    nitroGLYCERIN  (NITROSTAT ) 0.4 MG SL tablet  Place 1 tablet (0.4 mg total) under the tongue as needed for chest pain 25 tablet 1   pregabalin  (LYRICA ) 150 MG capsule Take 1 capsule (150 mg total) by mouth 2 (two) times daily. 60 capsule 1   rosuvastatin  (CRESTOR ) 40 MG tablet Take 1 tablet (40 mg total) by mouth at bedtime. 90 tablet 0   sildenafil  (VIAGRA ) 100 MG tablet Take 1 tablet (100 mg total) by mouth as needed, use 2-3 hours before sexual activity, max of 8-10 tablets in a month 30 tablet 0   acetaminophen  (TYLENOL ) 500 MG tablet Take 1,000 mg by mouth every 6 (six) hours as needed for mild pain (pain score 1-3) or moderate pain (pain score 4-6).       BELBUCA 150 MCG FILM Take 1 Film by mouth 2 (two) times daily.          No current facility-administered medications for this visit.        Allergies  No Known Allergies       REVIEW OF SYSTEMS:    [X]  denotes positive finding, [ ]  denotes negative finding Cardiac   Comments:  Chest pain or chest pressure:      Shortness of breath upon exertion:      Short of breath when lying flat:      Irregular heart rhythm:             Vascular      Pain in calf, thigh, or hip brought on by ambulation:      Pain in feet at night that wakes you up from your sleep:       Blood clot in your veins:      Leg swelling:              Pulmonary      Oxygen at home:      Productive cough:       Wheezing:              Neurologic      Sudden weakness in arms or legs:       Sudden numbness in arms or legs:       Sudden onset of difficulty speaking or slurred speech:      Temporary loss of vision in one eye:       Problems with dizziness:              Gastrointestinal      Blood in stool:       Vomited blood:              Genitourinary      Burning when urinating:       Blood in urine:             Psychiatric      Major depression:              Hematologic      Bleeding problems:      Problems with blood clotting too easily:              Skin      Rashes or ulcers:             Constitutional      Fever or chills:          PHYSICAL EXAMINATION:      Vitals:    12/29/23 1454  BP: (!) 162/93  Pulse: 67  Resp: 18  Temp: 97.8 F (36.6 C)  TempSrc: Temporal  SpO2: Aaron Aas)  87%      General:  WDWN in NAD; vital signs documented above Gait: Not observed HENT: WNL, normocephalic Pulmonary: normal non-labored breathing , without Rales, rhonchi,  wheezing Cardiac: regular HR Abdomen: soft, NT, no masses Skin: without rashes Vascular Exam/Pulses: Absent left pedal pulses; palpable right femoral pulse, palpable right DP pulse Extremities: without ischemic changes, without Gangrene , without cellulitis; without open wounds; some erythema in the toes of the left foot Musculoskeletal: no muscle wasting or atrophy       Neurologic: A&O X 3 Psychiatric:  The pt has Normal affect.     Non-Invasive Vascular Imaging:   Left SFA stent occluded by duplex Reconstitution of the below the knee popliteal artery   Left ABI of 0.5 with a TBI of 0       ASSESSMENT/PLAN:: 58 y.o. male who presented to the clinic as an urgent add-on due to 3-week history of rest pain in his left foot   Mr. Leabo has a 3-week history of left foot rest pain.  He believes this started suddenly 3 weeks ago.  Duplex demonstrates an occluded left SFA stent with an ABI of 0.5 and a toe pressure of 0.  This represents critical limb ischemia.  We discussed proceeding with aortogram with left lower extremity runoff and intervention of the occluded SFA stent with possible lysis.  This will be performed by Dr. Fulton Job on 12/31/2023.  He will continue his aspirin  and Plavix  perioperatively.  I encouraged smoking cessation.  We also discussed the possibility of being unable to revascularize the left leg endovascularly in which case he would require bypass surgery.  He is agreeable to proceed.     Cordie Deters, PA-C [facility] [phone]   Clinic MD:   Fulton Job

## 2023-12-31 NOTE — Progress Notes (Signed)
 Phlebotomist here to draw blood for lab work. Phlebotomist disconnected BP cuff to draw blood work and did not re-connect.

## 2023-12-31 NOTE — Progress Notes (Signed)
 Orthopedic Tech Progress Note Patient Details:  [name] [name] 1965/12/28 [id number]  Ortho Devices Type of Ortho Device: Knee Immobilizer Ortho Device/Splint Location: RLE Ortho Device/Splint Interventions: Ordered, Application, Adjustment   Post Interventions Patient Tolerated: Well Instructions Provided: Care of device   [name] [name] L [name] [name] 12/31/2023, 8:21 PM

## 2023-12-31 NOTE — Op Note (Signed)
 Patient name: [name] [name] MRN: [id number] DOB: 09/29/1965 Sex: male  12/31/2023 Pre-operative Diagnosis: Critical limb ischemia of left lower extremity with rest pain and occluded SFA stents Post-operative diagnosis:  Same Surgeon:  Young Hensen, MD Procedure Performed: 1.  Ultrasound-guided access right common femoral artery 2.  Aortogram with catheter selection of aorta 3.  Left lower extremity arteriogram with catheter selection of the left popliteal artery 4.  Initiation of chemical thrombolysis occluded left SFA stents (EKOS with tPA infusion) 5.  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  Seen in the office this week with 3 weeks of rest pain in the left foot.  Left SFA stents noted to be occluded.  He presents for left lower extremity angiogram and possible invention after risk benefits discussed.  Findings: Ultrasound-guided access right common femoral artery.  Aortogram showed patent infrarenal aorta with patent iliacs bilaterally.  There is calcified disease in the right common iliac artery.  On the left the common femoral and profunda were widely patent.  There was a short stump of SFA.  The SFA stents were occluded.  He did reconstitute his above-knee popliteal artery.  He had three-vessel runoff with a patent trifurcation.  Dominant office in the AT and PT.  Ultimately the occluded left SFA stents were crossed antegrade with a Glidewire advantage.  I confirmed with hand-injection I was in the true lumen distally.  A 135 cm EKOS catheter with 50 cm infusion length was placed through the occluded left SFA stents.  Will initiate tPA at 1 mg an hour with heparin  800 units an hour through the sheath.   Procedure:  The patient was identified in the holding area and taken to room 8.  The  patient was then placed supine on the table and prepped and draped in the usual sterile fashion.  A time out was called.  The patient received Versed  and fentanyl  for conscious moderate sedation.  Vital signs were monitored including heart rate, respiratory rate, oxygenation and blood pressure.  I was present for all of moderate sedation.  Ultrasound was used to evaluate the right common femoral artery.  It was patent .  A digital ultrasound image was acquired.  A micropuncture needle was used to access the right common femoral artery under ultrasound guidance.  An 018 wire was advanced without resistance and a micropuncture sheath was placed.  The 018 wire was removed and a benson wire was placed.  The micropuncture sheath was exchanged for a 5 french sheath.  An omniflush catheter was advanced over the wire to the level of L-1.  An abdominal angiogram was obtained.  Next, using the omniflush catheter and a benson wire, the aortic bifurcation was crossed and the catheter was placed into theleft external iliac artery and left runoff was obtained.  We elected to try and cross the occluded left SFA stents.  I used a Glidewire advantage and exchanged for a 6 [place] catapult sheath in the right groin over the aortic bifurcation this was placed in the left external iliac artery.  Patient was given 100 units/kg IV heparin .  I then used the glidewire advantage with a quick cross catheter and I was able to cross the occluded left SFA stents and get into the above-knee popliteal artery.  I confirmed with hand-injection I was in the true lumen.  I then selected EKOS catheter that was advanced through the occluded SFA stents over my wire and the wire was removed.  I then advanced the inner cannula of the EKOS catheter.  Will initiate tPA at 1 mg an hour and heparin  800 units through the sheath.  Patient remained stable during the case.  Plan: Thrombolysis tonight with return tomorrow for thrombolytics catheter  check.   Young Hensen, MD [facility] [place] Office: [phone]

## 2023-12-31 NOTE — Progress Notes (Addendum)
 PHARMACY - ANTICOAGULATION CONSULT NOTE  Pharmacy Consult for heparin  Indication: critical limb ischemia  No Known Allergies  Patient Measurements: Height: 6' (182.9 cm) Weight: 96.6 kg (213 lb) IBW/kg (Calculated) : 77.6 HEPARIN  DW (KG): 96.6  Vital Signs: Temp: 97.9 F (36.6 C) (05/22 0619) Temp Source: Oral (05/22 0619) BP: 125/80 (05/22 1100) Pulse Rate: 54 (05/22 1106)  Labs: Recent Labs    12/31/23 0554 12/31/23 1026  HGB 20.1* 19.6*  HCT 59.0* 58.7*  PLT  --  135*  CREATININE 1.00  --     Estimated Creatinine Clearance: 99.4 mL/min (by C-G formula based on SCr of 1 mg/dL).   Medical History: Past Medical History:  Diagnosis Date   Anxiety    Chronic pain disorder    COPD (chronic obstructive pulmonary disease) (HCC)    smoker   Coronary artery disease    a. NSTEMI 01/2013 s/p PTCA to prox RCA occlusion.   GERD (gastroesophageal reflux disease)    Gout    Headache(784.0)    HLD (hyperlipidemia)    Hypertension    Intercostal neuralgia    multiple rib fractures 2022   Screening for chemical poisoning and contamination    Tobacco abuse     Medications:  Scheduled:   [START ON 01/01/2024] aspirin  EC  81 mg Oral Daily   [START ON 01/01/2024] clopidogrel   75 mg Oral Daily   [START ON 01/01/2024] metoprolol  succinate  50 mg Oral Daily   oxyCODONE        [START ON 01/01/2024] rosuvastatin   40 mg Oral Daily   sodium chloride  flush  3 mL Intravenous Q12H    Assessment: 57 yom presenting with a 3 week history of rest pain in L foot - duplex finding occluded L SFA stent. Underwent aortogram with initiations of EKOS. No AC PTA.   Hgb 19.6, plt 135. Received 10,000 units of heparin  during procedure. Was started on heparin  infusion at 800 units/hr - also receiving alteplase at 1 mg/hr. No s/sx of bleeding.   Goal of Therapy:  Heparin  level 0.2-0.5 units/ml Monitor platelets by anticoagulation protocol: Yes   Plan:  Continue heparin  infusion at 800 units/hr   Continue alteplase 1 mg/hr  Follow heparin  level, fibrinogen, CBC every 6 hours, and for s/sx of bleeding while on catheter directed lysis   Thank you for allowing pharmacy to participate in this patient's care,  Nieves Bars, PharmD, BCCCP Clinical Pharmacist  Phone: [phone] 12/31/2023 12:18 PM  Please check AMION for all [facility] Pharmacy phone numbers After 10:00 PM, call Main Pharmacy [phone]  ADDENDUM Heparin  level is subtherapeutic at 0.1, on 800 units/hr. No s/sx of bleeding or infusion issues. Fibrinogen stable at 397. Will increase heparin  infusion to 900 units/hr >> follow heparin  level in 6 hours.   Thank you for allowing pharmacy to participate in this patient's care,  Nieves Bars, PharmD, BCCCP Clinical Pharmacist

## 2024-01-01 ENCOUNTER — Encounter (HOSPITAL_COMMUNITY)
Admission: AD | Disposition: A | Discharge status: Home / Self Care | Payer: Self-pay | Source: Home / Self Care | Attending: Vascular Surgery

## 2024-01-01 ENCOUNTER — Encounter (HOSPITAL_COMMUNITY): Payer: Self-pay | Admitting: Vascular Surgery

## 2024-01-01 ENCOUNTER — Telehealth: Payer: Self-pay

## 2024-01-01 DIAGNOSIS — I70202 Unspecified atherosclerosis of native arteries of extremities, left leg: Secondary | ICD-10-CM

## 2024-01-01 DIAGNOSIS — I743 Embolism and thrombosis of arteries of the lower extremities: Secondary | ICD-10-CM

## 2024-01-01 HISTORY — PX: PERIPHERAL VASCULAR THROMBECTOMY: CATH118306

## 2024-01-01 HISTORY — PX: LOWER EXTREMITY INTERVENTION: CATH118252

## 2024-01-01 LAB — CBC
HCT: 51.7 % (ref 39.0–52.0)
HCT: 50.2 % (ref 39.0–52.0)
Hemoglobin: 16.8 g/dL (ref 13.0–17.0)
Hemoglobin: 16.5 g/dL (ref 13.0–17.0)
MCH: 33.3 pg (ref 26.0–34.0)
MCH: 33.7 pg (ref 26.0–34.0)
MCHC: 32.5 g/dL (ref 30.0–36.0)
MCHC: 32.9 g/dL (ref 30.0–36.0)
MCV: 102.6 fL — ABNORMAL HIGH (ref 80.0–100.0)
MCV: 102.7 fL — ABNORMAL HIGH (ref 80.0–100.0)
Platelets: 104 10*3/uL — ABNORMAL LOW (ref 150–400)
Platelets: 105 10*3/uL — ABNORMAL LOW (ref 150–400)
RBC: 5.04 MIL/uL (ref 4.22–5.81)
RBC: 4.89 MIL/uL (ref 4.22–5.81)
RDW: 14 % (ref 11.5–15.5)
RDW: 14 % (ref 11.5–15.5)
WBC: 8.3 10*3/uL (ref 4.0–10.5)
WBC: 8.2 10*3/uL (ref 4.0–10.5)
nRBC: 0 % (ref 0.0–0.2)
nRBC: 0 % (ref 0.0–0.2)

## 2024-01-01 LAB — POCT ACTIVATED CLOTTING TIME: Activated Clotting Time: 135 s

## 2024-01-01 LAB — FIBRINOGEN
Fibrinogen: 185 mg/dL — ABNORMAL LOW (ref 210–475)
Fibrinogen: 251 mg/dL (ref 210–475)

## 2024-01-01 LAB — HEPARIN LEVEL (UNFRACTIONATED)
Heparin Unfractionated: <0.1 [IU]/mL — ABNORMAL LOW (ref 0.30–0.70)
Heparin Unfractionated: <0.1 [IU]/mL — ABNORMAL LOW (ref 0.30–0.70)

## 2024-01-01 SURGERY — PERIPHERAL VASCULAR THROMBECTOMY
Anesthesia: LOCAL

## 2024-01-01 MED ORDER — SODIUM CHLORIDE 0.9 % WEIGHT BASED INFUSION
1.0000 mL/kg/h | INTRAVENOUS | Status: AC
  Administered 2024-01-01: 1 mL/kg/h via INTRAVENOUS

## 2024-01-01 MED ORDER — ACETAMINOPHEN 325 MG PO TABS
650.0000 mg | ORAL_TABLET | ORAL | Status: DC | PRN
  Administered 2024-01-01 – 2024-01-02 (×4): 650 mg via ORAL
  Filled 2024-01-01 (×4): qty 2

## 2024-01-01 MED ORDER — FENTANYL CITRATE (PF) 100 MCG/2ML IJ SOLN
INTRAMUSCULAR | Status: AC
  Filled 2024-01-01: qty 2

## 2024-01-01 MED ORDER — FENTANYL CITRATE (PF) 100 MCG/2ML IJ SOLN
INTRAMUSCULAR | Status: DC | PRN
  Administered 2024-01-01: 50 ug via INTRAVENOUS

## 2024-01-01 MED ORDER — LABETALOL HCL 5 MG/ML IV SOLN
INTRAVENOUS | Status: AC
  Filled 2024-01-01: qty 4

## 2024-01-01 MED ORDER — LIDOCAINE HCL (PF) 1 % IJ SOLN
INTRAMUSCULAR | Status: DC | PRN
  Administered 2024-01-01: 5 mL

## 2024-01-01 MED ORDER — SODIUM CHLORIDE 0.9 % IV SOLN: 250.0000 mL | INTRAVENOUS | Status: DC | PRN

## 2024-01-01 MED ORDER — HYDROMORPHONE HCL 1 MG/ML IJ SOLN
0.5000 mg | INTRAMUSCULAR | Status: DC | PRN
  Administered 2024-01-01 (×2): 1 mg via INTRAVENOUS
  Filled 2024-01-01 (×2): qty 1

## 2024-01-01 MED ORDER — MIDAZOLAM HCL 2 MG/2ML IJ SOLN
INTRAMUSCULAR | Status: AC
  Filled 2024-01-01: qty 2

## 2024-01-01 MED ORDER — HYDRALAZINE HCL 20 MG/ML IJ SOLN: 5.0000 mg | INTRAMUSCULAR | Status: DC | PRN

## 2024-01-01 MED ORDER — LABETALOL HCL 5 MG/ML IV SOLN: 10.0000 mg | INTRAVENOUS | Status: DC | PRN

## 2024-01-01 MED ORDER — MIDAZOLAM HCL 2 MG/2ML IJ SOLN
INTRAMUSCULAR | Status: DC | PRN
  Administered 2024-01-01: 1 mg via INTRAVENOUS

## 2024-01-01 MED ORDER — HEPARIN SODIUM (PORCINE) 1000 UNIT/ML IJ SOLN
INTRAMUSCULAR | Status: DC | PRN
Start: 2024-01-01 — End: 2024-01-01
  Administered 2024-01-01: 8000 [IU] via INTRAVENOUS

## 2024-01-01 MED ORDER — LABETALOL HCL 5 MG/ML IV SOLN
INTRAVENOUS | Status: DC | PRN
Start: 2024-01-01 — End: 2024-01-01
  Administered 2024-01-01: 10 mg via INTRAVENOUS

## 2024-01-01 MED ORDER — LIDOCAINE HCL (PF) 1 % IJ SOLN
INTRAMUSCULAR | Status: AC
  Filled 2024-01-01: qty 30

## 2024-01-01 MED ORDER — SODIUM CHLORIDE 0.9% FLUSH
3.0000 mL | Freq: Two times a day (BID) | INTRAVENOUS | Status: DC
  Administered 2024-01-01: 3 mL via INTRAVENOUS

## 2024-01-01 MED ORDER — DEXTROSE 50 % IV SOLN: 12.5000 g | INTRAVENOUS | Status: DC

## 2024-01-01 MED ORDER — ALPRAZOLAM 0.5 MG PO TABS
1.0000 mg | ORAL_TABLET | Freq: Four times a day (QID) | ORAL | Status: DC | PRN
  Administered 2024-01-01 – 2024-01-02 (×3): 1 mg via ORAL
  Filled 2024-01-01 (×3): qty 2

## 2024-01-01 MED ORDER — HEPARIN SODIUM (PORCINE) 1000 UNIT/ML IJ SOLN
INTRAMUSCULAR | Status: AC
  Filled 2024-01-01: qty 10

## 2024-01-01 MED ORDER — HEPARIN (PORCINE) IN NACL 1000-0.9 UT/500ML-% IV SOLN
INTRAVENOUS | Status: DC | PRN
  Administered 2024-01-01: 1000 mL

## 2024-01-01 MED ORDER — SODIUM CHLORIDE 0.9% FLUSH: 3.0000 mL | INTRAVENOUS | Status: DC | PRN

## 2024-01-01 MED ORDER — IODIXANOL 320 MG/ML IV SOLN
INTRAVENOUS | Status: DC | PRN
  Administered 2024-01-01: 70 mL

## 2024-01-01 SURGICAL SUPPLY — 13 items
BALLOON STERLING OTW 6X80X135 (BALLOONS) IMPLANT
CATH QUICKCROSS .035X135CM (MICROCATHETER) IMPLANT
CLOSURE PERCLOSE PROSTYLE (VASCULAR PRODUCTS) IMPLANT
GLIDEWIRE ADV .035X260CM (WIRE) IMPLANT
KIT ENCORE 26 ADVANTAGE (KITS) IMPLANT
KIT MICROPUNCTURE NIT STIFF (SHEATH) IMPLANT
SHEATH CATAPULT 7F 45 MP (SHEATH) IMPLANT
STENT ELUVIA 7X40X130 (Permanent Stent) IMPLANT
STENT VIABAHN 7X25X120 (Permanent Stent) IMPLANT
STENT VIABAHN 7X7.5X120 (Permanent Stent) IMPLANT
TRAY PV CATH (CUSTOM PROCEDURE TRAY) IMPLANT
WIRE BENTSON .035X145CM (WIRE) IMPLANT
WIRE G V18X300CM (WIRE) IMPLANT

## 2024-01-01 NOTE — Plan of Care (Signed)

## 2024-01-01 NOTE — Progress Notes (Addendum)
       States left foot feels a little better   Doppler signals DP/PT poor doppler  Motor grossly intact  A/P   Critical limb ischemia of left lower extremity with rest pain and occluded SFA stents   Plan for return to [facility] for thrombolysis I will d/c versed  and Morphine  change to Xanax  home dose and Dilaudid  to hopefully give him better pain control and anxiety relief.     I have seen and evaluated the patient. I agree with the PA note as documented above.  Brisk left DP and PT signals after initiation of EKOS with thrombolysis of occluded SFA stents.  Return to Cath Lab today for thrombolytics catheter check.  Young Hensen, MD [facility] [place] Office: [phone]

## 2024-01-01 NOTE — Progress Notes (Signed)
 Overnight PRN orders for Morphine  and Versed  given as ordered. Orders written as followed "May follow morphine  dose 5 minutes later with 1 mg of IV midazolam (Versed ). If first dose of midazolam  is inadequate, may repeat an additional 1 mg of midazolam  after 5 minutes."   RN gave versed  at: 2129 with no follow up dose 0055 and at 0100 for the follow up dose 0255 and at 0300 for the follow up dose 0554 and at 0599 for the follow up dose  When pulling medication, RN pulled 4 total vials for the entire night and wasted 19ml/1mg  (half of one vial) from the 2129 dose that did not require follow up. RN used other half of the bottle for the 3 administrations that required follow up doses. Overseen by charge nurse Fontaine Ice RN.

## 2024-01-01 NOTE — Progress Notes (Signed)
 PHARMACY - ANTICOAGULATION CONSULT NOTE  Pharmacy Consult for heparin  Indication: EKOS  Labs: Recent Labs    12/31/23 0554 12/31/23 1026 12/31/23 1206 12/31/23 1954 01/01/24 0314  HGB 20.1* 19.6*  --  17.9* 16.8  HCT 59.0* 58.7*  --  54.4* 51.7  PLT  --  135*  --  120* 104*  HEPARINUNFRC  --   --  0.10* <0.10* <0.10*  CREATININE 1.00  --   --   --   --    Assessment: 58yo male remains subtherapeutic on heparin  after rate change; no infusion issues per RN but she does note some minor oozing; fibrinogen down to 185, borderline.  Goal of Therapy:  Heparin  level 0.2-0.5 units/ml   Plan:  Increase heparin  infusion cautiously by 2 units/kg/hr to 1200 units/hr. Check level with next scheduled lab draw.   Lonnie Roberts, PharmD, BCPS 01/01/2024 3:59 AM

## 2024-01-01 NOTE — Plan of Care (Signed)
  Problem: Education: Goal: Knowledge of General Education information will improve Description: Including pain rating scale, medication(s)/side effects and non-pharmacologic comfort measures Outcome: Progressing   Problem: Health Behavior/Discharge Planning: Goal: Ability to manage health-related needs will improve Outcome: Progressing   Problem: Clinical Measurements: Goal: Ability to maintain clinical measurements within normal limits will improve Outcome: Progressing Goal: Will remain free from infection Outcome: Progressing Goal: Diagnostic test results will improve Outcome: Progressing Goal: Respiratory complications will improve Outcome: Progressing Goal: Cardiovascular complication will be avoided Outcome: Progressing   Problem: Activity: Goal: Risk for activity intolerance will decrease Outcome: Progressing   Problem: Nutrition: Goal: Adequate nutrition will be maintained Outcome: Progressing   Problem: Coping: Goal: Level of anxiety will decrease Outcome: Progressing   Problem: Elimination: Goal: Will not experience complications related to bowel motility Outcome: Progressing Goal: Will not experience complications related to urinary retention Outcome: Progressing   Problem: Pain Managment: Goal: General experience of comfort will improve and/or be controlled Outcome: Progressing   Problem: Safety: Goal: Ability to remain free from injury will improve Outcome: Progressing   Problem: Skin Integrity: Goal: Risk for impaired skin integrity will decrease Outcome: Progressing   Problem: Education: Goal: Understanding of CV disease, CV risk reduction, and recovery process will improve Outcome: Progressing Goal: Individualized Educational Video(s) Outcome: Progressing   Problem: Activity: Goal: Ability to return to baseline activity level will improve Outcome: Progressing   Problem: Cardiovascular: Goal: Ability to achieve and maintain adequate  cardiovascular perfusion will improve Outcome: Progressing Goal: Vascular access site(s) Level 0-1 will be maintained Outcome: Progressing   Problem: Health Behavior/Discharge Planning: Goal: Ability to safely manage health-related needs after discharge will improve Outcome: Progressing  01/01/2024 1547 Note prepared by Laray Platt, RN

## 2024-01-01 NOTE — Op Note (Signed)
    Patient name: [name] [name] MRN: [id number] DOB: Jun 02, 1966 Sex: male  01/01/2024 Pre-operative Diagnosis: Left lower extremity pharmacomechanical thrombolysis for acute limb ischemia Post-operative diagnosis:  Same Surgeon:  Kayla Part, MD Procedure Performed: 1.  Left lower extremity angiogram from second-order cannulation 2.  SFA popliteal stenting Gore Viabahn 7 x 25 cm, 7 x 7.5 cm 3.  Proximal SFA stenting 7 x 40 mm 4.  Device assisted closure-Mynx 5.  Moderate sedation time 49 minutes, contrast volume 70 mL   Indications: Patient presented to the hospital with occlusion of left-sided SFA stents.  He started thrombolysis yesterday with my partner Dr. Fulton Job.  He presents today for lysis recheck.  After discussing risk benefits, Aarsh elected to proceed.  Findings:  Widely patent common femoral artery, profunda Patent left-sided SFA stents with some residual thrombus.   Stenosis proximal to SFA stent placement greater than 70%.  Stenosis in the distal SFA greater than 70% immediately distal to the stent No flow-limiting stenosis in the popliteal artery.  Three-vessel runoff to the foot.   Procedure:  The patient was identified in the holding area and taken to room 8.  The patient was then placed supine on the table and prepped and draped in the usual sterile fashion.  A time out was called.  The prior lytic catheter was removed using Seldinger technique and a 0.018 wire.  Next, left lower extremity angiogram followed.  I elected to intervene on the previously placed stents.  I did not want to the residual thrombus embolizing, so I elected to cover the stents using Gore Viabahn.  This required a 7 [place] sheath, therefore over a 0.035 wire, the 6 [place] sheath was removed and a 7 x 45 cm sheath was placed in the common femoral artery, from this position, Gore Viabahn stents were delivered and extended into the P1 segment of the popliteal artery.  The 7 x 25 cm Gore Viabahn was followed  by 7 x 7.5 cm Gore Viabahn which extended to roughly 2 cm from the ostia.  There was greater than 70% flow-limiting stenosis at the ostia, therefore I elected to use a 7 x 40 mm Eluvia as I wanted to ensure that I did not jailed the profunda.  Once deployed, this was all postdilated using a 6 mm balloon.  Follow-up angiography demonstrated excellent result with resolution of flowing stenosis both proximally and distally to the previously placed stents.  Also, no embolization distally patient continues to have three-vessel outflow to the foot.  Patient was closed with a Pro-glide twice without issue.  Impression, successful recanalization of the left-sided SFA stents with relining using Viabahn and proximal 7 x 40 mm Eluvia.    Kayla Part MD [facility] [place] Office: [phone]

## 2024-01-01 NOTE — Progress Notes (Signed)
 PHARMACY - ANTICOAGULATION CONSULT NOTE  Pharmacy Consult for heparin  Indication: critical limb ischemia  No Known Allergies  Patient Measurements: Height: 6' (182.9 cm) Weight: 96.6 kg (213 lb) IBW/kg (Calculated) : 77.6 HEPARIN  DW (KG): 96.6  Vital Signs: Temp: 98.3 F (36.8 C) (05/23 0812) Temp Source: Oral (05/23 0812) BP: 184/99 (05/23 1136) Pulse Rate: 0 (05/23 1201)  Labs: Recent Labs    12/31/23 0554 12/31/23 1026 12/31/23 1954 01/01/24 0314 01/01/24 0929  HGB 20.1*   < > 17.9* 16.8 16.5  HCT 59.0*   < > 54.4* 51.7 50.2  PLT  --    < > 120* 104* 105*  HEPARINUNFRC  --    < > <0.10* <0.10* <0.10*  CREATININE 1.00  --   --   --   --    < > = values in this interval not displayed.    Estimated Creatinine Clearance: 99.4 mL/min (by C-G formula based on SCr of 1 mg/dL).   Medical History: Past Medical History:  Diagnosis Date   Anxiety    Chronic pain disorder    COPD (chronic obstructive pulmonary disease) (HCC)    smoker   Coronary artery disease    a. NSTEMI 01/2013 s/p PTCA to prox RCA occlusion.   GERD (gastroesophageal reflux disease)    Gout    Headache(784.0)    HLD (hyperlipidemia)    Hypertension    Intercostal neuralgia    multiple rib fractures 2022   Screening for chemical poisoning and contamination    Tobacco abuse     Medications:  Scheduled:   aspirin  EC  81 mg Oral Daily   Chlorhexidine  Gluconate Cloth  6 each Topical Daily   clopidogrel   75 mg Oral Daily   DULoxetine   60 mg Oral Daily   metoprolol  succinate  50 mg Oral Daily   pregabalin   150 mg Oral BID   rosuvastatin   40 mg Oral Daily   sodium chloride  flush  3 mL Intravenous Q12H   sodium chloride  flush  3 mL Intravenous Q12H    Assessment: 57 yom presenting with a 3 week history of rest pain in L foot - duplex finding occluded L SFA stent. Underwent aortogram with initiations of EKOS. No AC PTA.   Heparin  level undetectable again this AM on heparin  at 1200 units/hr.   CBC stable.    Heparin  and alteplase infusions were subsequently discontinued after the OR.  No plans for further anticoagulation were noted.  Goal of Therapy:  Heparin  level 0.2-0.5 units/ml Monitor platelets by anticoagulation protocol: Yes   Plan:  Pharmacy will continue to follow peripherally.  Joanell Mowers, Davey Erp, BCCP Clinical Pharmacist  01/01/2024 12:38 PM   [facility] pharmacy phone numbers are listed on [email or web address]

## 2024-01-01 NOTE — Telephone Encounter (Signed)
 Per [employer], a Peer to Peer review must be scheduled for surgery date of 12/30/23.  [name] [name] is still pending.

## 2024-01-02 ENCOUNTER — Encounter (HOSPITAL_COMMUNITY): Payer: Self-pay | Admitting: Vascular Surgery

## 2024-01-02 DIAGNOSIS — F17200 Nicotine dependence, unspecified, uncomplicated: Secondary | ICD-10-CM

## 2024-01-02 DIAGNOSIS — Z95820 Peripheral vascular angioplasty status with implants and grafts: Secondary | ICD-10-CM

## 2024-01-02 LAB — GLUCOSE, CAPILLARY: Glucose-Capillary: 128 mg/dL — ABNORMAL HIGH (ref 70–99)

## 2024-01-02 MED ORDER — ALBUTEROL SULFATE HFA 108 (90 BASE) MCG/ACT IN AERS: 2.0000 | INHALATION_SPRAY | RESPIRATORY_TRACT | Status: DC

## 2024-01-02 MED ORDER — ALBUTEROL SULFATE (2.5 MG/3ML) 0.083% IN NEBU
2.5000 mg | INHALATION_SOLUTION | RESPIRATORY_TRACT | Status: DC
  Administered 2024-01-02 (×2): 2.5 mg via RESPIRATORY_TRACT
  Filled 2024-01-02 (×2): qty 3

## 2024-01-02 MED ORDER — ALLOPURINOL 100 MG PO TABS
100.0000 mg | ORAL_TABLET | Freq: Every day | ORAL | Status: DC
  Administered 2024-01-02: 100 mg via ORAL
  Filled 2024-01-02: qty 1

## 2024-01-02 MED ORDER — FLUTICASONE PROPIONATE 50 MCG/ACT NA SUSP
2.0000 | Freq: Every day | NASAL | Status: DC
  Administered 2024-01-02: 2 via NASAL
  Filled 2024-01-02: qty 16

## 2024-01-02 NOTE — Plan of Care (Signed)

## 2024-01-02 NOTE — Discharge Instructions (Signed)
   [facility]  Discharge Instructions  Lower Extremity Angiogram; Angioplasty/Stenting  Please refer to the following instructions for your post-procedure care. Your surgeon or physician assistant will discuss any changes with you.  Activity  Avoid lifting more than 8 pounds (1 gallons of milk) for 72 hours (3 days) after your procedure. You may walk as much as you can tolerate. It's OK to drive after 72 hours.  Bathing/Showering  You may shower the day after your procedure. If you have a bandage, you may remove it at 24- 48 hours. Clean your incision site with mild soap and water. Pat the area dry with a clean towel.  Diet  Resume your pre-procedure diet. There are no special food restrictions following this procedure. All patients with peripheral vascular disease should follow a low fat/low cholesterol diet. In order to heal from your surgery, it is CRITICAL to get adequate nutrition. Your body requires vitamins, minerals, and protein. Vegetables are the best source of vitamins and minerals. Vegetables also provide the perfect balance of protein. Processed food has little nutritional value, so try to avoid this.  Medications  Resume taking all of your medications unless your doctor tells you not to. If your incision is causing pain, you may take over-the-counter pain relievers such as acetaminophen (Tylenol)  Follow Up  Follow up will be arranged at the time of your procedure. You may have an office visit scheduled or may be scheduled for surgery. Ask your surgeon if you have any questions.  Please call us immediately for any of the following conditions: Severe or worsening pain your legs or feet at rest or with walking. Increased pain, redness, drainage at your groin puncture site. Fever of 101 degrees or higher. If you have any mild or slow bleeding from your puncture site: lie down, apply firm constant pressure over the area with a piece of  gauze or a clean wash cloth for 30 minutes- no peeking!, call 911 right away if you are still bleeding after 30 minutes, or if the bleeding is heavy and unmanageable.  Reduce your risk factors of vascular disease:  Stop smoking. If you would like help call [facility] at 1-800-QUIT-NOW (254-819-8059) or [place] at (250)353-2060. Manage your cholesterol Maintain a desired weight Control your diabetes Keep your blood pressure down  If you have any questions, please call the office at [phone]

## 2024-01-02 NOTE — Discharge Summary (Signed)
 Discharge summary:  Patient admitted left lower extremity rest pain to occluded SFA stents.  He continues to smoke, is not compliant with his Plavix .  Patient now status post pharmacomechanical thrombolysis, followed by relining of the previously placed left SFA stents with extension both proximally and distally.  The stents were relined due to residual thrombus.  Pulses palpable.  Medication regimen at discharge antiplatelet therapy, high intensity statin.  Kayla Part MD

## 2024-01-02 NOTE — Progress Notes (Signed)
  Daily Progress Note  S/p: Left lower extremity pharmacomechanical thrombolysis, relining previous left SFA stents  Subjective: Doing well this morning, no major complaints, does note some mild edema in the left leg  Objective: Vitals:   01/02/24 0800 01/02/24 0827  BP: 106/71   Pulse: 76   Resp: 14   Temp:  98.2 F (36.8 C)  SpO2: (!) 86%     Physical Examination No right groin hematoma, mild edema in the left leg Palpable pulses in the left foot 2 L nasal cannula  ASSESSMENT/PLAN:  Patient doing well status post recanalization of left-sided stents with relining. Needs to wean oxygen, home albuterol  ordered. Once oxygen weaned, can discharge.   Kayla Part MD MS [facility] [phone] 01/02/2024  9:22 AM

## 2024-01-07 ENCOUNTER — Telehealth: Payer: Self-pay

## 2024-01-07 NOTE — Telephone Encounter (Signed)
 Pt called 01/06/24 c/o pain and swelling in his left leg s/p successful recanalization of the left-sided SFA stents.  Returned pt's call 01/07/2024 to check on his leg. Pt. reported great improvement over night. He reported reduced pain and swelling. He reported the color of the foot is returning to normal and looks like the right foot. He also reported that the foot is warm.  Pt reported he is elevating the leg and walking throughout the day.    Pt advised to continue to monitor his leg and elevate as needed.  He will let us  know if the leg becomes cool and/or the pain increases.

## 2024-01-11 ENCOUNTER — Telehealth: Payer: Self-pay

## 2024-01-11 NOTE — Telephone Encounter (Signed)
 Pt called reporting pain behind left knee and "creeking."  Mild swelling and numbness to upper thigh. Pt reported extremity temperature and color are normal and same as the right leg. Pt reported being nervous that something is wrong, worried about emboli. Pt remains on ASA,Plavix  and Crestor   Pt encouraged to monitor swelling and pain. Patient advised to elevate legs when not active and to walk as much as tolerated. Pt advised that any acute changes - increased pain, change in temp or color of leg and/or foot to give us  a call or go to the ED.

## 2024-01-19 ENCOUNTER — Ambulatory Visit: Payer: Self-pay

## 2024-02-01 DIAGNOSIS — M792 Neuralgia and neuritis, unspecified: Secondary | ICD-10-CM | POA: Diagnosis not present

## 2024-02-01 DIAGNOSIS — S2241XS Multiple fractures of ribs, right side, sequela: Secondary | ICD-10-CM | POA: Diagnosis not present

## 2024-02-09 ENCOUNTER — Ambulatory Visit: Payer: Self-pay

## 2024-02-10 ENCOUNTER — Other Ambulatory Visit: Payer: Self-pay | Admitting: *Deleted

## 2024-02-10 DIAGNOSIS — I70222 Atherosclerosis of native arteries of extremities with rest pain, left leg: Secondary | ICD-10-CM

## 2024-02-10 DIAGNOSIS — I739 Peripheral vascular disease, unspecified: Secondary | ICD-10-CM

## 2024-02-10 NOTE — Progress Notes (Signed)
US orders in

## 2024-02-23 ENCOUNTER — Ambulatory Visit (INDEPENDENT_AMBULATORY_CARE_PROVIDER_SITE_OTHER): Admitting: Physician Assistant

## 2024-02-23 ENCOUNTER — Ambulatory Visit (HOSPITAL_BASED_OUTPATIENT_CLINIC_OR_DEPARTMENT_OTHER)
Admission: RE | Admit: 2024-02-23 | Discharge: 2024-02-23 | Disposition: A | Discharge status: Home / Self Care | Source: Ambulatory Visit | Attending: Vascular Surgery | Admitting: Vascular Surgery

## 2024-02-23 ENCOUNTER — Ambulatory Visit (HOSPITAL_COMMUNITY)
Admission: RE | Admit: 2024-02-23 | Discharge: 2024-02-23 | Disposition: A | Discharge status: Home / Self Care | Source: Ambulatory Visit | Attending: Vascular Surgery | Admitting: Vascular Surgery

## 2024-02-23 VITALS — BP 120/76 | HR 58 | Temp 98.1°F | Resp 18 | Ht 72.0 in | Wt 217.8 lb

## 2024-02-23 DIAGNOSIS — I739 Peripheral vascular disease, unspecified: Secondary | ICD-10-CM

## 2024-02-23 DIAGNOSIS — I70222 Atherosclerosis of native arteries of extremities with rest pain, left leg: Secondary | ICD-10-CM | POA: Diagnosis not present

## 2024-02-23 LAB — VAS US ABI WITH/WO TBI
Left ABI: 0.91
Right ABI: 0.55

## 2024-02-23 NOTE — Progress Notes (Signed)
 Office Note     CC:  follow up Requesting Provider:  Chinnasamy, Krishnamani*  HPI: [name] [name] is a 58 y.o. (24-May-1966) male who presents status post thrombolysis of occluded left SFA stent followed by restenting of the left SFA and popliteal using Viabahn on 12/31/2023 and 01/01/2024.  Prior to thrombolysis he was experiencing rest pain in the left foot.  Rest pain has completely resolved.  He does still have some numbness at the fifth toe amputation site and lateral foot.  He also has pain in his left thigh however this is improving.  He has mild calf claudication in his right calf however this is not lifestyle limiting.  He is taking his aspirin , Plavix , statin daily.  It should be noted he has a known right SFA occlusion.  He continues to smoke on a daily basis.   Past Medical History:  Diagnosis Date   Anxiety    Chronic pain disorder    COPD (chronic obstructive pulmonary disease) (HCC)    smoker   Coronary artery disease    a. NSTEMI 01/2013 s/p PTCA to prox RCA occlusion.   GERD (gastroesophageal reflux disease)    Gout    Headache(784.0)    HLD (hyperlipidemia)    Hypertension    Intercostal neuralgia    multiple rib fractures 2022   Screening for chemical poisoning and contamination    Tobacco abuse     Past Surgical History:  Procedure Laterality Date   ABDOMINAL AORTOGRAM N/A 12/31/2023   Procedure: ABDOMINAL AORTOGRAM;  Surgeon: Gretta Lonni PARAS, MD;  Location: [facility];  Service: Cardiovascular;  Laterality: N/A;   ABDOMINAL AORTOGRAM W/LOWER EXTREMITY N/A 10/30/2022   Procedure: ABDOMINAL AORTOGRAM W/LOWER EXTREMITY;  Surgeon: Gretta Lonni PARAS, MD;  Location: [facility];  Service: Cardiovascular;  Laterality: N/A;   AMPUTATION Left 06/24/2023   Procedure: LEFT FIFTH TOE METATARSALPHALANGEAL DISARTICULATION AND AMPUTATION;  Surgeon: Barton Drape, MD;  Location: [facility];  Service: Orthopedics;  Laterality: Left;    AMPUTATION TOE Left 06/26/2022   Procedure: AMPUTATION TOE 5th toe;  Surgeon: Barton Drape, MD;  Location: [facility];  Service: Orthopedics;  Laterality: Left;  choice   CARDIAC CATHETERIZATION     ELBOW SURGERY     from AA   KNEE SURGERY  2009   left knee orthroscopy   LEFT HEART CATHETERIZATION WITH CORONARY ANGIOGRAM N/A 01/10/2013   Procedure: LEFT HEART CATHETERIZATION WITH CORONARY ANGIOGRAM;  Surgeon: Aleene PARAS Passe, MD;  Location: [facility] CATH LAB;  Service: Cardiovascular;  Laterality: N/A;   LOWER EXTREMITY ANGIOGRAPHY N/A 12/31/2023   Procedure: Lower Extremity Angiography;  Surgeon: Gretta Lonni PARAS, MD;  Location: [facility] INVASIVE CV LAB;  Service: Cardiovascular;  Laterality: N/A;   LOWER EXTREMITY INTERVENTION N/A 12/31/2023   Procedure: LOWER EXTREMITY INTERVENTION;  Surgeon: Gretta Lonni PARAS, MD;  Location: [facility];  Service: Cardiovascular;  Laterality: N/A;   LOWER EXTREMITY INTERVENTION  01/01/2024   Procedure: LOWER EXTREMITY INTERVENTION;  Surgeon: Lanis Fonda BRAVO, MD;  Location: [facility] INVASIVE CV LAB;  Service: Cardiovascular;;   PERIPHERAL VASCULAR INTERVENTION Left 10/30/2022   Procedure: PERIPHERAL VASCULAR INTERVENTION;  Surgeon: Gretta Lonni PARAS, MD;  Location: [facility] INVASIVE CV LAB;  Service: Cardiovascular;  Laterality: Left;   PERIPHERAL VASCULAR THROMBECTOMY N/A 01/01/2024   Procedure: PERIPHERAL VASCULAR THROMBECTOMY;  Surgeon: Lanis Fonda BRAVO, MD;  Location: [facility] INVASIVE CV LAB;  Service: Cardiovascular;  Laterality: N/A;   RIB PLATING Right 09/17/2020  Procedure: RIB PLATING 5-9;  Surgeon: Kerrin Elspeth BROCKS, MD;  Location: [facility] OR;  Service: Thoracic;  Laterality: Right;    Social History   Socioeconomic History   Marital status: Married    Spouse name: Not on file   Number of children: Not on file   Years of education: Not on file   Highest education level: Not on file  Occupational History   Not on file  Tobacco Use    Smoking status: Every Day    Current packs/day: 0.00    Average packs/day: 1 pack/day for 15.0 years (15.0 ttl pk-yrs)    Types: Cigarettes    Start date: 01/09/1998    Last attempt to quit: 01/09/2013    Years since quitting: 11.1   Smokeless tobacco: Never  Vaping Use   Vaping status: Never Used  Substance and Sexual Activity   Alcohol  use: Yes    Comment: occ   Drug use: No   Sexual activity: Yes  Other Topics Concern   Not on file  Social History Narrative   Not on file   Social Drivers of Health   Financial Resource Strain: Not on file  Food Insecurity: No Food Insecurity (12/31/2023)   Hunger Vital Sign    Worried About Running Out of Food in the Last Year: Never true    Ran Out of Food in the Last Year: Never true  Transportation Needs: No Transportation Needs (12/31/2023)   PRAPARE - Administrator, Civil Service (Medical): No    Lack of Transportation (Non-Medical): No  Physical Activity: Not on file  Stress: Not on file  Social Connections: Unknown (12/24/2021)   Received from [facility]   Social Network    Social Network: Not on file  Intimate Partner Violence: Not At Risk (12/31/2023)   Humiliation, Afraid, Rape, and Kick questionnaire    Fear of Current or Ex-Partner: No    Emotionally Abused: No    Physically Abused: No    Sexually Abused: No    Family History  Problem Relation Age of Onset   CAD Brother 27   Stroke Father    COPD Sister    Drug abuse Brother    Healthy Son    Healthy Son    Healthy Daughter     Current Outpatient Medications  Medication Sig Dispense Refill   acetaminophen  (TYLENOL ) 500 MG tablet Take 1,000 mg by mouth every 6 (six) hours as needed for mild pain (pain score 1-3) or moderate pain (pain score 4-6).     allopurinol  (ZYLOPRIM ) 100 MG tablet Take 1 tablet (100 mg total) by mouth daily. 90 tablet 0   ALPRAZolam  (XANAX ) 1 MG tablet Take 1 tablet (1 mg total) by mouth 4 (four) times daily as needed. 120  tablet 2   aspirin  EC (ASPIRIN  81) 81 MG tablet Take 1 tablet (81 mg total) by mouth daily with food 90 tablet 0   BELBUCA 150 MCG FILM Take 1 Film by mouth 2 (two) times daily.     clopidogrel  (PLAVIX ) 75 MG tablet TAKE 1 TABLET BY MOUTH DAILY 90 tablet 3   DULoxetine  (CYMBALTA ) 30 MG capsule Take 2 capsules (60 mg total) by mouth daily. 60 capsule 1   metoprolol  succinate (TOPROL -XL) 50 MG 24 hr tablet Take 1 tablet (50 mg total) by mouth daily. (Patient taking differently: Take 25 mg by mouth daily.) 90 tablet 0   nitroGLYCERIN  (NITROSTAT ) 0.4 MG SL tablet Place 1 tablet (0.4 mg total) under  the tongue as needed for chest pain 25 tablet 1   pregabalin  (LYRICA ) 150 MG capsule Take 1 capsule (150 mg total) by mouth 2 (two) times daily. 60 capsule 1   rosuvastatin  (CRESTOR ) 40 MG tablet Take 1 tablet (40 mg total) by mouth at bedtime. 90 tablet 0   sildenafil  (VIAGRA ) 100 MG tablet Take 1 tablet (100 mg total) by mouth as needed, use 2-3 hours before sexual activity, max of 8-10 tablets in a month 30 tablet 0   No current facility-administered medications for this visit.    No Known Allergies   REVIEW OF SYSTEMS:   [X]  denotes positive finding, [ ]  denotes negative finding Cardiac  Comments:  Chest pain or chest pressure:    Shortness of breath upon exertion:    Short of breath when lying flat:    Irregular heart rhythm:        Vascular    Pain in calf, thigh, or hip brought on by ambulation:    Pain in feet at night that wakes you up from your sleep:     Blood clot in your veins:    Leg swelling:         Pulmonary    Oxygen at home:    Productive cough:     Wheezing:         Neurologic    Sudden weakness in arms or legs:     Sudden numbness in arms or legs:     Sudden onset of difficulty speaking or slurred speech:    Temporary loss of vision in one eye:     Problems with dizziness:         Gastrointestinal    Blood in stool:     Vomited blood:         Genitourinary     Burning when urinating:     Blood in urine:        Psychiatric    Major depression:         Hematologic    Bleeding problems:    Problems with blood clotting too easily:        Skin    Rashes or ulcers:        Constitutional    Fever or chills:      PHYSICAL EXAMINATION:  Vitals:   02/23/24 1403  BP: 120/76  Pulse: (!) 58  Resp: 18  Temp: 98.1 F (36.7 C)  TempSrc: Oral  Weight: 217 lb 12.8 oz (98.8 kg)  Height: 6' (1.829 m)    General:  WDWN in NAD; vital signs documented above Gait: Not observed HENT: WNL, normocephalic Pulmonary: normal non-labored breathing Cardiac: regular HR Abdomen: soft, NT, no masses Skin: without rashes Vascular Exam/Pulses: palpable L DP pulse; R groin without hematoma Extremities: without ischemic changes, without Gangrene , without cellulitis; without open wounds Musculoskeletal: no muscle wasting or atrophy  Neurologic: A&O X 3 Psychiatric:  The pt has Normal affect.   Non-Invasive Vascular Imaging:   Widely patent left SFA stents  ABI/TBIToday's ABIToday's TBIPrevious ABIPrevious TBI  +-------+-----------+-----------+------------+------------+  Right 0.55       0.28       0.76        0.56          +-------+-----------+-----------+------------+------------+  Left  0.91       0.92       0.52        0.0  ASSESSMENT/PLAN:: 58 y.o. male status post thrombolysis of occluded left SFA stenting followed by SFA and popliteal stenting using Viabahn  Left foot well-perfused with a palpable DP pulse.  Duplex demonstrates widely patent stents in the left SFA and popliteal artery.  He has some soreness deep in his left thigh; this may be related to resolving hematoma after thrombolysis.  He has a known right SFA occlusion however only mild calf claudication symptoms.  No indication for revascularization at this time.  We discussed the importance of smoking cessation especially having  thrombosed his previous  left SFA stent.  We will repeat left lower extremity arterial duplex and ABI in 6 months.  He will continue his aspirin , Plavix , statin daily.  He knows to call/return office sooner with any questions or concerns.   Donnice Sender, PA-C [facility] [phone]  Clinic MD:   [place]

## 2024-02-24 ENCOUNTER — Other Ambulatory Visit: Payer: Self-pay

## 2024-02-24 DIAGNOSIS — I70222 Atherosclerosis of native arteries of extremities with rest pain, left leg: Secondary | ICD-10-CM

## 2024-02-29 ENCOUNTER — Encounter: Payer: Self-pay | Admitting: Medical

## 2024-02-29 ENCOUNTER — Ambulatory Visit: Attending: Medical | Admitting: Medical

## 2024-02-29 VITALS — BP 90/59 | HR 75 | Ht 72.0 in | Wt 220.0 lb

## 2024-02-29 DIAGNOSIS — Z720 Tobacco use: Secondary | ICD-10-CM

## 2024-02-29 DIAGNOSIS — F410 Panic disorder [episodic paroxysmal anxiety] without agoraphobia: Secondary | ICD-10-CM | POA: Diagnosis not present

## 2024-02-29 DIAGNOSIS — I10 Essential (primary) hypertension: Secondary | ICD-10-CM | POA: Diagnosis not present

## 2024-02-29 DIAGNOSIS — I739 Peripheral vascular disease, unspecified: Secondary | ICD-10-CM

## 2024-02-29 DIAGNOSIS — I2510 Atherosclerotic heart disease of native coronary artery without angina pectoris: Secondary | ICD-10-CM

## 2024-02-29 DIAGNOSIS — R079 Chest pain, unspecified: Secondary | ICD-10-CM | POA: Diagnosis not present

## 2024-02-29 DIAGNOSIS — Z79899 Other long term (current) drug therapy: Secondary | ICD-10-CM

## 2024-02-29 MED ORDER — METOPROLOL SUCCINATE ER 25 MG PO TB24: 12.5000 mg | ORAL_TABLET | Freq: Every day | ORAL | 3 refills | Status: AC

## 2024-02-29 MED ORDER — NITROGLYCERIN 0.4 MG SL SUBL: 0.4000 mg | SUBLINGUAL_TABLET | SUBLINGUAL | 3 refills | Status: AC | PRN

## 2024-02-29 MED ORDER — PANTOPRAZOLE SODIUM 40 MG PO TBEC: 40.0000 mg | DELAYED_RELEASE_TABLET | Freq: Every day | ORAL | 11 refills | Status: AC

## 2024-02-29 MED ORDER — ROSUVASTATIN CALCIUM 40 MG PO TABS
40.0000 mg | ORAL_TABLET | Freq: Every evening | ORAL | 3 refills | Status: AC
Start: 2024-02-29 — End: ?

## 2024-02-29 NOTE — Progress Notes (Signed)
 Cardiology Office Note   Date:  02/29/2024  ID:  Najir, Roop Aug 20, 1965, MRN [id number] PCP: Chinnasamy, Krishnamani, MD  [facility] Providers Cardiologist:  New patient  History of Present Illness [name] [name] is a 58 y.o. male with a history of PAD, anxiety, COPD, CAD, GERD, hyperlipidemia, hypertension, tobacco use who presents as a new patient.  The patient was admitted in 01/2013 with a non-STEMI.  Cardiac cath showed total occlusion of the proximal RCA which appeared to be recent.  There was otherwise nonobstructive disease in the diagonal 2.  The patient underwent PTCA of the proximal RCA with balloon angioplasty only (the RCA was found to be small in caliber and diffusely diseased from the proximal vessel throughout the mid and distal vessel and into the PDA).  Diagnostic cath did not show filling of the right PDA from left-to-right collaterals.  He was started on aspirin  and Effient  for at least 1 month.  Cath showed preserved LV function.  He was started on beta-blocker and statin.  He was seen back in the office 01/20/2013 but then subsequently lost to follow-up.  The patient has a h/o PAD with left toe partial amputation by Dr. Arleen November 2023. This was slow to heal and he underwent left SFA angioplasty and stenting due to some long segment chronic total occlusion 10/30/2022 by Dr. Gretta.  He then underwent further fifth toe amputation and has since healed.  More recently he was seen in the office 12/31/2023 with left foot rest pain for 3 weeks.  Duplex showed an occluded left SFA stent with an ABI of 0.5 and a toe pressure of 0, representing critical limb ischemia.  Patient was admitted and underwent pharmacomechanical thrombolysis followed by relining of the previously placed SFA stents with extension both proximally and distally. He also underwent popliteal stenting. He was discharged 01/02/2024.  Note suggests he is not compliant with Plavix .  Today,  the patient reports he has been doing OK. He reports chest discomfort that feels like acid reflux, which is similar to when he had the previous heart attack. He also feels tired. He denies SOB. He is not working at this time due to rib injury. He reports lifestyle is mainly sedentary. Diet is OK, he tries to eat low salt. Drinks occasionally. No drug history. He smokes, 1ppd. No recent fever, chills, nausea, vomiting. He feels legs are still sore, but improving. Still has left foot numbness. BP at home mostly is normal. He has occasional lightheadedness. He takes acid-reflux medication as needed.   He is unsure if he wants to follow-up in [place] or [place].   Studies Reviewed EKG Interpretation Date/Time:  Monday February 29 2024 90:73:75 EDT Ventricular Rate:  75 PR Interval:  154 QRS Duration:  82 QT Interval:  362 QTC Calculation: 404 R Axis:   14  Text Interpretation: Normal sinus rhythm Possible Left atrial enlargement When compared with ECG of 31-Dec-2023 06:17, No significant change was found Confirmed by Franchester, Edger Husain ([id number]) on 02/29/2024 9:30:08 AM    Procedure Performed:  1. PTCA with balloon angioplasty only of the occluded RCA   Operator: Lonni Cash, MD   Indication:   58 yo male with history of tobacco abuse, hyperlipidemia admitted with NSTEMI. Diagnostic cath this am per Dr. Alveta with occlusion of RCA in the proximal segment. I am asked to attempt PCI of the occluded vessel.  Procedure Details: The risks, benefits, complications, treatment options, and expected outcomes were discussed with the patient. The patient and/or family concurred with the proposed plan, giving informed consent. When I entered the case, a 5 French sheath was present in the right radial artery. I exchanged the sheath for a 6 [place] system. The patient was given 60 mg Effient  po x 1. He was given a bolus of Angiomax  and a drip was started. I then engaged  the RCA with a 6 [place] JR4 guide. When the ACT was greater than 200, I passed a BMW wire down the RCA. The wire passed easily beyond the proximal total occlusion. I then inflated a 2.5 x 12 mm balloon x 4 in the proximal and mid vessel. There was faint flow in the mid and distal vessel however the vessel appeared to be small in caliber and diffusely diseased. IC NTG was given. There was TIMI-1 flow into the mid and distal vessel. I reviewed the films with Dr. Alveta and we made the decision to terminate the procedure. Diagnostic cath did show filling of the right PDA from left to right collaterals. The wire and guide were removed. The sheath was removed.  A Terumo hemostasis band was applied on the right wrist.    There were no immediate complications. The patient was taken to the recovery area in stable condition.    Hemodynamic Findings: Central aortic pressure: 105/68   Impression: 1. NSTEMI secondary to total occlusion of the proximal RCA 2. PTCA of the proximal RCA with balloon angioplasty only. The RCA was found to be small in caliber and diffusely diseased from the proximal vessel throughout the mid and distal vessel and into the PDA.    Recommendations: I would treat him with dual anti-platelet therapy (ASA/Effient ) for at least one month. Continue statin and beta blocker.  Risk Assessment/Calculations           Physical Exam VS:  BP (!) 90/59 (BP Location: Right Arm, Patient Position: Sitting, Cuff Size: Normal)   Pulse 75   Ht 6' (1.829 m)   Wt 220 lb (99.8 kg)   SpO2 93%   BMI 29.84 kg/m        Wt Readings from Last 3 Encounters:  02/29/24 220 lb (99.8 kg)  02/23/24 217 lb 12.8 oz (98.8 kg)  12/31/23 213 lb (96.6 kg)    GEN: Well nourished, well developed in no acute distress NECK: No JVD; No carotid bruits CARDIAC: RRR, no murmurs, rubs, gallops RESPIRATORY:  Clear to auscultation without rales, wheezing or rhonchi  ABDOMEN: Soft, non-tender,  non-distended EXTREMITIES:  No edema; No deformity   ASSESSMENT AND PLAN  CAD with PTCA RCA in 2014 Chest discomfort/fatigue NSTEMI in 2014 with PTCA of the RCA (report above). He was lost to follow-up soon afterwards. He has PAD stenting on ASA and Plavix . He reports occasional chest discomfort similar to prior MI. It may be from GERD. No SOB, but does have fatigue. EKG today non-ischemic. I will send in Protonix  40mg  daily. I will refill SL NTG and Crestor . I will order a Cardiac PET stress test.   HTN Patient is hypotensive today, but asymptomatic. He takes Toprol  25mg  daily. I will decrease this to 12.5mg  daily. I recommend he check BP at home. We will re-evaluate at follow-up.   HLD He reports history of high triglycerides. No recent lipid panel. I will check a lipid panel today. Refill Crestor  40mg  daily today.   Tobacco use He smokes 1 ppd,  cessation discussed. I recommend regular PCP or pulmonology follow-up.  PAD With recent left lower extremity pharmacomechanical thrombolysis and relining left SFA stents and popliteal stengin. He is followed by [facility]. Continue ASA, Plavix , and Crestor .     Informed Consent   Shared Decision Making/Informed Consent The risks [chest pain, shortness of breath, cardiac arrhythmias, dizziness, blood pressure fluctuations, myocardial infarction, stroke/transient ischemic attack, nausea, vomiting, allergic reaction, radiation exposure, metallic taste sensation and life-threatening complications (estimated to be 1 in 10,000)], benefits (risk stratification, diagnosing coronary artery disease, treatment guidance) and alternatives of a cardiac PET stress test were discussed in detail with [name] [name] and he agrees to proceed.     Dispo: Follow-up in 4 weeks  Signed, Mazie Fencl VEAR Fishman, PA-C

## 2024-02-29 NOTE — Patient Instructions (Addendum)
 Medication Instructions:  Your physician recommends the following medication changes.  START: Protonix  40 mg by mouth daily   DECREASE: Metoprolol  to 12.5 mg by mouth daily  *If you need a refill on your cardiac medications before your next appointment, please call your pharmacy*  Lab Work: Your provider would like for you to have following labs drawn today CBC, CMP, TSH, Lipid.     Testing/Procedures:   Please report to Radiology at [facility] Main Entrance, medical mall, 30 mins prior to your test.  [address]  [place], [place]  How to Prepare for Your Cardiac PET/CT Stress Test:  Nothing to eat or drink, except water, 3 hours prior to arrival time.  NO caffeine/decaffeinated products, or chocolate 12 hours prior to arrival. (Please note decaffeinated beverages (teas/coffees) still contain caffeine).  If you have caffeine within 12 hours prior, the test will need to be rescheduled.  Medication instructions: Do not take erectile dysfunction medications for 72 hours prior to test (sildenafil , tadalafil) Do not take nitrates (isosorbide mononitrate, Ranexa) the day before or day of test Do not take tamsulosin the day before or morning of test Hold theophylline containing medications for 12 hours. Hold Dipyridamole 48 hours prior to the test.  Diabetic Preparation: If able to eat breakfast prior to 3 hour fasting, you may take all medications, including your insulin. Do not worry if you miss your breakfast dose of insulin - start at your next meal. If you do not eat prior to 3 hour fast-Hold all diabetes (oral and insulin) medications. Patients who wear a continuous glucose monitor MUST remove the device prior to scanning.  You may take your remaining medications with water.  NO perfume, cologne or lotion on chest or abdomen area. FEMALES - Please avoid wearing dresses to this appointment.  Total time is 1 to 2 hours; you may want to bring  reading material for the waiting time.  IF YOU THINK YOU MAY BE PREGNANT, OR ARE NURSING PLEASE INFORM THE TECHNOLOGIST.  In preparation for your appointment, medication and supplies will be purchased.  Appointment availability is limited, so if you need to cancel or reschedule, please call the Radiology Department Scheduler at [phone] 24 hours in advance to avoid a cancellation fee of $100.00  What to Expect When you Arrive:  Once you arrive and check in for your appointment, you will be taken to a preparation room within the Radiology Department.  A technologist or Nurse will obtain your medical history, verify that you are correctly prepped for the exam, and explain the procedure.  Afterwards, an IV will be started in your arm and electrodes will be placed on your skin for EKG monitoring during the stress portion of the exam. Then you will be escorted to the PET/CT scanner.  There, staff will get you positioned on the scanner and obtain a blood pressure and EKG.  During the exam, you will continue to be connected to the EKG and blood pressure machines.  A small, safe amount of a radioactive tracer will be injected in your IV to obtain a series of pictures of your heart along with an injection of a stress agent.    After your Exam:  It is recommended that you eat a meal and drink a caffeinated beverage to counter act any effects of the stress agent.  Drink plenty of fluids for the remainder of the day and urinate frequently for the first couple of hours after the exam.  Your  doctor will inform you of your test results within 7-10 business days.  For more information and frequently asked questions, please visit our website: [email or web address]  For questions about your test or how to prepare for your test, please call: Cardiac Imaging Nurse Navigators Office: [phone]   Follow-Up: At [facility], you and your health needs are our priority.  As part of our  continuing mission to provide you with exceptional heart care, our providers are all part of one team.  This team includes your primary Cardiologist (physician) and Advanced Practice Providers or APPs (Physician Assistants and Nurse Practitioners) who all work together to provide you with the care you need, when you need it.  Your next appointment:   4 week(s)  Provider:   Mikey Fishman, PA-C

## 2024-03-01 ENCOUNTER — Ambulatory Visit: Payer: Self-pay | Admitting: Medical

## 2024-03-01 DIAGNOSIS — Z79899 Other long term (current) drug therapy: Secondary | ICD-10-CM

## 2024-03-01 LAB — COMPREHENSIVE METABOLIC PANEL WITH GFR
ALT: 13 IU/L (ref 0–44)
AST: 22 IU/L (ref 0–40)
Albumin: 4.3 g/dL (ref 3.8–4.9)
Alkaline Phosphatase: 61 IU/L (ref 44–121)
BUN/Creatinine Ratio: 14 (ref 9–20)
BUN: 13 mg/dL (ref 6–24)
Bilirubin Total: 0.3 mg/dL (ref 0.0–1.2)
CO2: 20 mmol/L (ref 20–29)
Calcium: 9.4 mg/dL (ref 8.7–10.2)
Chloride: 100 mmol/L (ref 96–106)
Creatinine, Ser: 0.93 mg/dL (ref 0.76–1.27)
Globulin, Total: 2.3 g/dL (ref 1.5–4.5)
Glucose: 89 mg/dL (ref 70–99)
Potassium: 5.3 mmol/L — ABNORMAL HIGH (ref 3.5–5.2)
Sodium: 138 mmol/L (ref 134–144)
Total Protein: 6.6 g/dL (ref 6.0–8.5)
eGFR: 96 mL/min/1.73 (ref >59)

## 2024-03-01 LAB — CBC
Hematocrit: 57.8 % — ABNORMAL HIGH (ref 37.5–51.0)
Hemoglobin: 19.1 g/dL — ABNORMAL HIGH (ref 13.0–17.7)
MCH: 34.3 pg — ABNORMAL HIGH (ref 26.6–33.0)
MCHC: 33 g/dL (ref 31.5–35.7)
MCV: 104 fL — ABNORMAL HIGH (ref 79–97)
Platelets: 146 x10E3/uL — ABNORMAL LOW (ref 150–450)
RBC: 5.57 x10E6/uL (ref 4.14–5.80)
RDW: 13.6 % (ref 11.6–15.4)
WBC: 7.4 x10E3/uL (ref 3.4–10.8)

## 2024-03-01 LAB — TSH: TSH: 1.08 u[IU]/mL (ref 0.450–4.500)

## 2024-03-01 LAB — LIPID PANEL
Chol/HDL Ratio: 3.2 ratio (ref 0.0–5.0)
Cholesterol, Total: 98 mg/dL — ABNORMAL LOW (ref 100–199)
HDL: 31 mg/dL — ABNORMAL LOW (ref >39)
LDL Chol Calc (NIH): 28 mg/dL (ref 0–99)
Triglycerides: 259 mg/dL — ABNORMAL HIGH (ref 0–149)
VLDL Cholesterol Cal: 39 mg/dL (ref 5–40)

## 2024-03-03 ENCOUNTER — Telehealth: Payer: Self-pay | Admitting: Medical

## 2024-03-03 NOTE — Telephone Encounter (Signed)
Patient returned call for lab results.  

## 2024-03-03 NOTE — Telephone Encounter (Signed)
 Left voicemail message to call back

## 2024-03-04 NOTE — Telephone Encounter (Signed)
 Patient viewed lab results via My Chart. Closing encounter.

## 2024-03-09 NOTE — Discharge Summary (Signed)
 Discharge Summary  Patient ID: [name] [name] [id number] 58 y.o. [birth year]  Admit date: 12/31/2023  Discharge date and time: 01/02/2024  2:50 PM   Admitting Physician: Lonni JINNY Gaskins, MD   Discharge Physician: Dr. Lanis  Admission Diagnoses: Critical limb ischemia of left lower extremity [facility]) [I70.222]  Discharge Diagnoses: same  Admission Condition: poor  Discharged Condition: good  Indication for Admission: Thrombolysis  Hospital Course: [name] [name] is a 58 year old male who presented to the clinic with left lower extremity rest pain.  He was found to have occluded SFA stents by duplex.  He was brought in as an outpatient and underwent left lower extremity angiogram with initiation of chemical thrombolysis of occluded left SFA stents by Dr. Gaskins on 12/31/2023.  He was admitted to the ICU postoperatively.  He was brought back the following day for recheck lysis and underwent proximal SFA stenting as well as distal SFA and popliteal stenting by Dr. Lanis.  He was kept in the ICU postoperatively.  The following day right groin was without hematoma and he had a palpable left DP pulse in the left foot.  He was weaned from oxygen and mobilized that morning.  He will be continued on aspirin , Plavix , and high intensity statin at discharge.  He was encouraged to stop smoking to prevent recurrent thrombosis of his stents.  He will follow-up in the office in 4 to 6 weeks with a left lower extremity arterial duplex and ABI.  He was discharged home in stable condition.  Consults: None  Treatments: surgery: Left lower extremity angiogram with initiation of chemical thrombolysis of occluded left SFA stents by Dr. Gaskins on 12/31/2023  Recheck lysis with proximal SFA stenting, distal SFA and popliteal stenting by Dr. Lanis on 01/01/2024.  Discharge Exam: See progress note 01/02/2024 Vitals:   01/02/24 1300 01/02/24 1322  BP: 99/66   Pulse: 79 70  Resp: (!) 23 14  Temp:    SpO2:  92% 95%     Disposition: Discharge disposition: 01-Home or Self Care       Patient Instructions:  Allergies as of 01/02/2024   No Known Allergies      Medication List     TAKE these medications    acetaminophen  500 MG tablet Commonly known as: TYLENOL  Take 1,000 mg by mouth every 6 (six) hours as needed for mild pain (pain score 1-3) or moderate pain (pain score 4-6).   allopurinol  100 MG tablet Commonly known as: ZYLOPRIM  Take 1 tablet (100 mg total) by mouth daily.   ALPRAZolam  1 MG tablet Commonly known as: Xanax  Take 1 tablet (1 mg total) by mouth 4 (four) times daily as needed.   Aspirin  Low Dose 81 MG tablet Generic drug: aspirin  EC Take 1 tablet (81 mg total) by mouth daily with food   clopidogrel  75 MG tablet Commonly known as: PLAVIX  TAKE 1 TABLET BY MOUTH DAILY   DULoxetine  30 MG capsule Commonly known as: CYMBALTA  Take 2 capsules (60 mg total) by mouth daily.   pregabalin  150 MG capsule Commonly known as: LYRICA  Take 1 capsule (150 mg total) by mouth 2 (two) times daily.   sildenafil  100 MG tablet Commonly known as: VIAGRA  Take 1 tablet (100 mg total) by mouth as needed, use 2-3 hours before sexual activity, max of 8-10 tablets in a month               Discharge Care Instructions  (From admission, onward)  Start     Ordered   01/02/24 0000  No dressing needed        01/02/24 1349           Activity: activity as tolerated Diet: regular diet Wound Care: none needed  Follow-up with [facility] in 6 weeks.  SignedBETHA Donnice Sender, PA-C 03/09/2024 11:07 AM [facility] Office: [phone]

## 2024-04-05 ENCOUNTER — Encounter (HOSPITAL_COMMUNITY): Payer: Self-pay | Admitting: Medical

## 2024-04-19 ENCOUNTER — Ambulatory Visit: Admitting: Medical

## 2024-05-03 DIAGNOSIS — M792 Neuralgia and neuritis, unspecified: Secondary | ICD-10-CM | POA: Diagnosis not present

## 2024-05-03 DIAGNOSIS — Z79899 Other long term (current) drug therapy: Secondary | ICD-10-CM | POA: Diagnosis not present

## 2024-05-03 DIAGNOSIS — S2241XS Multiple fractures of ribs, right side, sequela: Secondary | ICD-10-CM | POA: Diagnosis not present

## 2024-06-21 DIAGNOSIS — Z79899 Other long term (current) drug therapy: Secondary | ICD-10-CM | POA: Diagnosis not present

## 2024-06-21 DIAGNOSIS — M792 Neuralgia and neuritis, unspecified: Secondary | ICD-10-CM | POA: Diagnosis not present

## 2024-06-21 DIAGNOSIS — M545 Low back pain, unspecified: Secondary | ICD-10-CM | POA: Diagnosis not present

## 2024-06-21 DIAGNOSIS — S2241XS Multiple fractures of ribs, right side, sequela: Secondary | ICD-10-CM | POA: Diagnosis not present

## 2024-08-23 ENCOUNTER — Ambulatory Visit

## 2024-08-23 ENCOUNTER — Ambulatory Visit (HOSPITAL_COMMUNITY)

## 2024-11-15 ENCOUNTER — Ambulatory Visit

## 2024-11-15 ENCOUNTER — Ambulatory Visit (HOSPITAL_COMMUNITY)
